# Patient Record
Sex: Male | Born: 1965 | Race: White | Hispanic: No | Smoking: Never smoker
Health system: Southern US, Community
[De-identification: ages and names within clinical notes are randomized; demographics above are authoritative.]

## PROBLEM LIST (undated history)

## (undated) ENCOUNTER — Emergency Department (HOSPITAL_COMMUNITY): Admission: EM | Payer: PRIVATE HEALTH INSURANCE | Source: Home / Self Care

## (undated) DIAGNOSIS — F419 Anxiety disorder, unspecified: Secondary | ICD-10-CM

## (undated) DIAGNOSIS — R740 Nonspecific elevation of levels of transaminase and lactic acid dehydrogenase [LDH]: Secondary | ICD-10-CM

## (undated) DIAGNOSIS — N200 Calculus of kidney: Secondary | ICD-10-CM

## (undated) DIAGNOSIS — M1990 Unspecified osteoarthritis, unspecified site: Secondary | ICD-10-CM

## (undated) DIAGNOSIS — R7401 Elevation of levels of liver transaminase levels: Secondary | ICD-10-CM

## (undated) DIAGNOSIS — N201 Calculus of ureter: Secondary | ICD-10-CM

## (undated) DIAGNOSIS — K259 Gastric ulcer, unspecified as acute or chronic, without hemorrhage or perforation: Secondary | ICD-10-CM

## (undated) DIAGNOSIS — J42 Unspecified chronic bronchitis: Secondary | ICD-10-CM

## (undated) DIAGNOSIS — R0602 Shortness of breath: Secondary | ICD-10-CM

## (undated) HISTORY — PX: TONSILLECTOMY: SUR1361

## (undated) HISTORY — PX: CHOLECYSTECTOMY: SHX55

---

## 2004-02-12 ENCOUNTER — Emergency Department (HOSPITAL_COMMUNITY): Admission: EM | Admit: 2004-02-12 | Discharge: 2004-02-13 | Payer: Self-pay | Admitting: Emergency Medicine

## 2008-12-20 ENCOUNTER — Emergency Department (HOSPITAL_COMMUNITY): Admission: EM | Admit: 2008-12-20 | Discharge: 2008-12-20 | Payer: Self-pay | Admitting: Emergency Medicine

## 2009-05-05 ENCOUNTER — Inpatient Hospital Stay (HOSPITAL_COMMUNITY): Admission: EM | Admit: 2009-05-05 | Discharge: 2009-05-06 | Payer: Self-pay | Admitting: Emergency Medicine

## 2009-05-05 ENCOUNTER — Encounter (INDEPENDENT_AMBULATORY_CARE_PROVIDER_SITE_OTHER): Payer: Self-pay | Admitting: Surgery

## 2010-12-25 LAB — DIFFERENTIAL
Basophils Relative: 0 % (ref 0–1)
Eosinophils Absolute: 0.1 10*3/uL (ref 0.0–0.7)
Lymphs Abs: 1.3 10*3/uL (ref 0.7–4.0)

## 2010-12-25 LAB — LIPASE, BLOOD: Lipase: 23 U/L (ref 11–59)

## 2010-12-25 LAB — POCT CARDIAC MARKERS: Myoglobin, poc: 132 ng/mL (ref 12–200)

## 2010-12-25 LAB — CBC
HCT: 46.4 % (ref 39.0–52.0)
Hemoglobin: 16 g/dL (ref 13.0–17.0)
MCHC: 34.4 g/dL (ref 30.0–36.0)
MCV: 86.5 fL (ref 78.0–100.0)
WBC: 14.9 10*3/uL — ABNORMAL HIGH (ref 4.0–10.5)

## 2010-12-25 LAB — COMPREHENSIVE METABOLIC PANEL
ALT: 68 U/L — ABNORMAL HIGH (ref 0–53)
Albumin: 4.4 g/dL (ref 3.5–5.2)
BUN: 18 mg/dL (ref 6–23)
Calcium: 10.1 mg/dL (ref 8.4–10.5)
Chloride: 101 mEq/L (ref 96–112)
Creatinine, Ser: 1.05 mg/dL (ref 0.4–1.5)
GFR calc Af Amer: >60 mL/min (ref >60)
Glucose, Bld: 162 mg/dL — ABNORMAL HIGH (ref 70–99)

## 2010-12-25 LAB — URINALYSIS, ROUTINE W REFLEX MICROSCOPIC
Bilirubin Urine: NEGATIVE
Specific Gravity, Urine: 1.024 (ref 1.005–1.030)
pH: 8 (ref 5.0–8.0)

## 2010-12-29 LAB — POCT I-STAT, CHEM 8
Calcium, Ion: 1.11 mmol/L — ABNORMAL LOW (ref 1.12–1.32)
Glucose, Bld: 150 mg/dL — ABNORMAL HIGH (ref 70–99)
HCT: 45 % (ref 39.0–52.0)
Sodium: 138 mEq/L (ref 135–145)
TCO2: 25 mmol/L (ref 0–100)

## 2010-12-29 LAB — POCT CARDIAC MARKERS: CKMB, poc: <1 ng/mL — ABNORMAL LOW (ref 1.0–8.0)

## 2010-12-29 LAB — HEPATIC FUNCTION PANEL
ALT: 34 U/L (ref 0–53)
Total Protein: 6.8 g/dL (ref 6.0–8.3)

## 2010-12-29 LAB — LIPASE, BLOOD: Lipase: 24 U/L (ref 11–59)

## 2011-02-01 NOTE — H&P (Signed)
NAMECECILIA, NISHIKAWA NO.:  [id number]   MEDICAL RECORD NO.:  [id number]          PATIENT TYPE:  INP   LOCATION:  5120                         FACILITY:  [facility]   PHYSICIAN:  Currie Paris, M.D.DATE OF BIRTH:  06/16/1966   DATE OF ADMISSION:  05/05/2009  DATE OF DISCHARGE:                              HISTORY & PHYSICAL   CHIEF COMPLAINT:  Abdominal pain.   HISTORY OF PRESENT ILLNESS:  Mr. Landgrebe is a pleasant 45 year old male  who presents with a worsening abdominal pain since last evening.  He and  his family actually state that the pain has been ongoing for several  months on an intermittent basis.  He reports the pain is usually later  in the evening after dinner or sometimes wakes him up from his sleep.  He had previously been told that he may have some gastritis or chronic  gastroesophageal reflux disease and every time, he had an attack he is  sort of shrugged it off as an another bout of indigestion.  However, he  has probably had upwards of 5-6 different episodes of this kind of  abdominal pain over the past several months.  However last evening, he  developed worsening pain about 7:30 in the evening, shortly after  dinner.  However, the pain began to become more constant and severe in  nature to the point where at about 3 in the morning, he could not  tolerate anymore and had to come to the emergency department.  He had  associated little bit of chills and sweats, but no significant fever.  He has been a little bit nauseated, but otherwise, no significant  vomiting.  He denies chest pain, shortness of breath associated with  this.  He denies any hematemesis, hematochezia, or melena.  He denies  changes in his recent bowel functions including diarrhea or  constipation.  The patient essentially does not go to doctors without  any major complaint.  Therefore, past medical history is essentially  negative.  He otherwise presents to the emergency  department with  complaint of significant abdominal pain.   PAST MEDICAL HISTORY:  Negative for any pertinent history other than  possible gastroesophageal reflux disease.   PAST SURGICAL HISTORY:  Negative.   SOCIAL HISTORY:  The patient denies any alcohol or tobacco use or  illicit drug use.  He is married, but currently out of work.   FAMILY HISTORY:  His father was recently diagnosed with coronary artery  disease and had to have coronary artery bypass procedure.  Otherwise,  negative.   MEDICATIONS:  None regular.   ALLERGIES:  The patient reports CODEINE allergy.   REVIEW OF SYSTEMS:  Please see HPI.   PHYSICAL EXAMINATION:  VITAL SIGNS:  Temperature is 98 degrees, pulse  62, respirations 15, and blood pressure 133/79.  GENERAL APPEARANCE:  The patient is somewhat sedated and sleepy  secondary to narcotic pain medicine that was administrated in the  emergency department, but is otherwise in some mild distress.  ENT:  Unremarkable.  NECK:  Supple.  Carotids are 2+ and brisk.  No lymphadenopathy is  appreciated.  CHEST:  Clear to auscultation.  No wheezes, rhonchi, or rales.  HEART:  Regular rate and rhythm.  No murmurs, gallops, or rubs.  2+  peripheral pulses throughout upper and lower extremities.  ABDOMEN:  Soft, nondistended, but tender in the mid epigastric in right  upper quadrant regions.  There is mild guarding, but no rebound or  peritonitis-type signs.  He does have audible bowel sounds and no  surgical scars noted.  GENITOURINARY:  No hernias are appreciated.  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  No genitourinary lesions are noted.  EXTREMITIES:  The patient is full range of motion with regards to active  range of motion and no edema is appreciated.  SKIN:  Warm, dry, good turgor.  No rashes are appreciated.  NEUROLOGIC:  The patient is alert and oriented x3.  Cranial nerves II  through XII grossly intact.   PERTINENT LABORATORY DATA:  White blood cell count  of 14.9, total  bilirubin of 0.9.  CT scan performed shows evidence consistent with  acute cholecystitis including multiple gallstones, gallbladder wall-  thickening, and pericholecystic fluid.  The pancreas and liver show  normal enhancement.   ADMISSION DIAGNOSES:  1. Acute cholecystitis.  2. History of gastroesophageal reflux disease.   ADMISSION PLAN:  Proceed with admission for IV fluid resuscitation and  surgical intervention including laparoscopic cholecystectomy with  cholangiogram.  The procedure of laparoscopic cholecystectomy and  cholangiogram including the risks, complications, benefits, as well as  alternatives to treatment were discussed thoroughly with patient and his  family.  They agreed to proceed with the procedure.  We will start IV  Unasyn 3 g IV, first dose is already administrated here in the emergency  department.  We will admit him to medical surgical floor and again  proceed with laparoscopic cholecystectomy.  This case was discussed at  length with Dr. Cyndia Bent who was in agreement in admission of  the patient.      Brayton El, PA-C      Currie Paris, M.D.  Electronically Signed    KB/MEDQ  D:  05/05/2009  T:  05/05/2009  Job:  [id number]

## 2011-02-01 NOTE — Op Note (Signed)
[name], [name] NO.:  [id number]   MEDICAL RECORD NO.:  [id number]          PATIENT TYPE:  INP   LOCATION:  5120                         FACILITY:  [facility]   PHYSICIAN:  Currie Paris, M.D.DATE OF BIRTH:  April 03, 1966   DATE OF PROCEDURE:  05/05/2009  DATE OF DISCHARGE:                               OPERATIVE REPORT   PREOPERATIVE DIAGNOSIS:  Acute calculous cholecystitis.   POSTOPERATIVE DIAGNOSIS:  Acute calculous cholecystitis.   OPERATION:  Laparoscopic cholecystectomy with cholangiogram.   SURGEON:  Currie Paris, MD   ASSISTANT:  Letha Cape, PA   ANESTHESIA:  General.   CLINICAL HISTORY:  This is a 45 year old gentleman, admitted this  morning via the emergency room with signs and symptoms of acute  cholecystitis, and a CT scan showing appearance consistent with acute  cholecystitis.   DESCRIPTION OF PROCEDURE:  I saw the patient again in the holding area,  and he had no further questions.   He was then taken to the operating room and after satisfactory general  anesthesia had been obtained, the abdomen was clipped, prepped, and  draped.  The time-out was done.   Marcaine 0.25% plain was used for each incision.  The umbilical incision  was made.  The fascia opened and the peritoneal cavity entered under  direct vision.  A purse-string was placed, the Hasson introduced, and  the abdomen insufflated to 15.   The patient was tilted in reverse Trendelenburg and to the left.  Under  direct vision, a 10/11 trocar was placed in the epigastrium and two 5 mm  laterally.   The gallbladder was tense, edematous, and inflamed.  It had not  developed any gangrenous changes.  In order to grasp it, I placed a  suction device through the trocar and aspirated the gallbladder.   Multiple omental adhesions were taken down, and the area of the triangle  of Calot dissected out and identified a long segment of cystic duct.  There was a small  vessel nearby, which I clipped and then the anterior  branch of the artery was also clipped.  I had a nice window and felt  comfortable with the anatomy.   The cystic duct was opened, and a Cook catheter introduced  percutaneously and held with a clip.  Operative cholangiography was done  showing a normal common duct, good filling of the duodenum, no filling  defects, and what appeared to be grossly normal anatomy.   The cystic duct catheter was removed, 3 clips were placed on the cystic  duct, and it was divided.  Two additional clips were placed on the  cystic artery and it was divided.  The gallbladder was removed from  below to above.  A posterior branch of the artery was clipped and an  another higher little branch coming directly out of the gallbladder bed,  and the gallbladder was likewise clipped.   Once the gallbladder was removed, it was placed in a bag.  I irrigated  to make sure everything was dry.  I brought it out the umbilical port.  We then made a final  check for hemostasis and again everything was dry.  The irrigant was suctioned out.  The lateral ports were removed and  there was no bleeding.  The umbilical site was closed with a purse-  string plus a suture of 0 Vicryl.  The abdomen was deflated through the  epigastric port.  Skin was closed with 4-0 Monocryl subcuticular plus  Dermabond.   The patient tolerated the procedure well and there were no  complications.  All counts were correct.      Currie Paris, M.D.  Electronically Signed     CJS/MEDQ  D:  05/05/2009  T:  05/06/2009  Job:  [id number]

## 2011-08-24 ENCOUNTER — Emergency Department (HOSPITAL_COMMUNITY)
Admission: EM | Admit: 2011-08-24 | Discharge: 2011-08-24 | Disposition: A | Discharge status: Home / Self Care | Payer: Self-pay | Attending: Emergency Medicine | Admitting: Emergency Medicine

## 2011-08-24 ENCOUNTER — Emergency Department (HOSPITAL_COMMUNITY): Payer: Self-pay

## 2011-08-24 DIAGNOSIS — K029 Dental caries, unspecified: Secondary | ICD-10-CM | POA: Insufficient documentation

## 2011-08-24 DIAGNOSIS — Z0000 Encounter for general adult medical examination without abnormal findings: Secondary | ICD-10-CM

## 2011-08-24 DIAGNOSIS — R059 Cough, unspecified: Secondary | ICD-10-CM | POA: Insufficient documentation

## 2011-08-24 DIAGNOSIS — R05 Cough: Secondary | ICD-10-CM | POA: Insufficient documentation

## 2011-08-24 HISTORY — DX: Calculus of kidney: N20.0

## 2011-08-24 LAB — CBC
HCT: 45.1 % (ref 39.0–52.0)
Hemoglobin: 15.6 g/dL (ref 13.0–17.0)
MCHC: 34.6 g/dL (ref 30.0–36.0)
WBC: 6.7 10*3/uL (ref 4.0–10.5)

## 2011-08-24 LAB — COMPREHENSIVE METABOLIC PANEL
ALT: 24 U/L (ref 0–53)
AST: 26 U/L (ref 0–37)
Albumin: 3.7 g/dL (ref 3.5–5.2)
Alkaline Phosphatase: 94 U/L (ref 39–117)
BUN: 10 mg/dL (ref 6–23)
CO2: 28 mEq/L (ref 19–32)
Calcium: 9.6 mg/dL (ref 8.4–10.5)
Chloride: 103 mEq/L (ref 96–112)
Creatinine, Ser: 0.89 mg/dL (ref 0.50–1.35)
GFR calc Af Amer: >90 mL/min (ref >90)
GFR calc non Af Amer: >90 mL/min (ref >90)
Glucose, Bld: 96 mg/dL (ref 70–99)
Potassium: 4.2 mEq/L (ref 3.5–5.1)
Sodium: 140 mEq/L (ref 135–145)
Total Bilirubin: 0.5 mg/dL (ref 0.3–1.2)
Total Protein: 7 g/dL (ref 6.0–8.3)

## 2011-08-24 LAB — DIFFERENTIAL
Basophils Relative: 1 % (ref 0–1)
Eosinophils Absolute: 0.2 10*3/uL (ref 0.0–0.7)
Eosinophils Relative: 3 % (ref 0–5)
Monocytes Absolute: 0.3 10*3/uL (ref 0.1–1.0)
Monocytes Relative: 5 % (ref 3–12)
Neutro Abs: 4.4 10*3/uL (ref 1.7–7.7)

## 2011-08-24 LAB — PROTIME-INR
INR: 0.9 (ref 0.00–1.49)
Prothrombin Time: 12.3 seconds (ref 11.6–15.2)

## 2011-08-24 MED ORDER — AMOXICILLIN 500 MG PO CAPS: 500.0000 mg | ORAL_CAPSULE | Freq: Three times a day (TID) | ORAL | Status: AC

## 2011-08-24 NOTE — ED Provider Notes (Signed)
History     CSN: [id number] Arrival date & time: 08/24/2011 11:37 AM   First MD Initiated Contact with Patient 08/24/11 1312      Chief Complaint  Patient presents with  . Coagulation Disorder    (Consider location/radiation/quality/duration/timing/severity/associated sxs/prior treatment) HPI  Patient relates he's worked for jobs for the past 8 years. He also states he is raising 2 teenage children by himself. He relates this year he's had a lot of respiratory problems and up until 3 weeks ago had a very bad cough that his family stated they thought he had walking pneumonia. He's unsure if he had a fever. He also relates about 3 weeks ago had severe pain in his back that lasted about 2 minutes it felt like a kidney stone he's had before. He states he started noticing 6 days ago that when he brushed his teeth he had more blood on his toothbrush. He also has noticed when he clears the throat he has blood-tinged saliva. He denies nosebleed, he states he has been sneezing, denies hematuria blood in the stool, he denies any pain in his teeth. Patient states he's tired. Patient has looking been looking on the Internet and is extremely afraid of what could be wrong with himself. Nothing makes the bleeding worse nothing makes the bleeding better  Family physician none  Past Medical History  Diagnosis Date  . Kidney stones     Past Surgical History  Procedure Date  . Cholecystectomy     No family history on file.  History  Substance Use Topics  . Smoking status: Never Smoker   . Smokeless tobacco: Not on file  . Alcohol Use: No   employed He denies using snuff for other chewable tobacco   Review of Systems  All other systems reviewed and are negative.    Allergies  Codeine  Home Medications  No current outpatient prescriptions on file.  BP 131/83  Pulse 74  Temp(Src) 98.3 F (36.8 C) (Oral)  Resp 12  SpO2 96% Vital signs normal  Physical Exam  Nursing note and  vitals reviewed. Constitutional: He is oriented to person, place, and time. He appears well-developed and well-nourished.  Non-toxic appearance. He does not appear ill. No distress.  HENT:  Head: Normocephalic and atraumatic.  Right Ear: External ear normal.  Left Ear: External ear normal.  Nose: Nose normal. No mucosal edema or rhinorrhea.  Mouth/Throat: Oropharynx is clear and moist and mucous membranes are normal. No dental abscesses or uvula swelling.       Patient has 2 teeth that are rotted to the gum and one on the right 1 on the left but the gums are not inflamed there is no active bleeding seen  Eyes: Conjunctivae and EOM are normal. Pupils are equal, round, and reactive to light.  Neck: Normal range of motion and full passive range of motion without pain. Neck supple.  Cardiovascular: Normal rate, regular rhythm and normal heart sounds.  Exam reveals no gallop and no friction rub.   No murmur heard. Pulmonary/Chest: Effort normal and breath sounds normal. No respiratory distress. He has no wheezes. He has no rhonchi. He has no rales. He exhibits no tenderness and no crepitus.  Abdominal: Soft. Normal appearance and bowel sounds are normal. He exhibits no distension. There is no tenderness. There is no rebound and no guarding.  Musculoskeletal: Normal range of motion. He exhibits no edema and no tenderness.       Moves all extremities well.  Neurological: He is alert and oriented to person, place, and time. He has normal strength. No cranial nerve deficit.  Skin: Skin is warm, dry and intact. No rash noted. No erythema. No pallor.  Psychiatric: His speech is normal and behavior is normal. His mood appears not anxious.       Patient appears very flat and almost becomes tearful when he talks about working.    ED Course  Procedures (including critical care time)   Results for orders placed during the hospital encounter of 08/24/11  CBC      Component Value Range   WBC 6.7  4.0 -  10.5 (K/uL)   RBC 5.42  4.22 - 5.81 (MIL/uL)   Hemoglobin 15.6  13.0 - 17.0 (g/dL)   HCT 19.1  47.8 - 29.5 (%)   MCV 83.2  78.0 - 100.0 (fL)   MCH 28.8  26.0 - 34.0 (pg)   MCHC 34.6  30.0 - 36.0 (g/dL)   RDW 62.1  30.8 - 65.7 (%)   Platelets 185  150 - 400 (K/uL)  DIFFERENTIAL      Component Value Range   Neutrophils Relative 66  43 - 77 (%)   Neutro Abs 4.4  1.7 - 7.7 (K/uL)   Lymphocytes Relative 26  12 - 46 (%)   Lymphs Abs 1.7  0.7 - 4.0 (K/uL)   Monocytes Relative 5  3 - 12 (%)   Monocytes Absolute 0.3  0.1 - 1.0 (K/uL)   Eosinophils Relative 3  0 - 5 (%)   Eosinophils Absolute 0.2  0.0 - 0.7 (K/uL)   Basophils Relative 1  0 - 1 (%)   Basophils Absolute 0.0  0.0 - 0.1 (K/uL)  COMPREHENSIVE METABOLIC PANEL      Component Value Range   Sodium 140  135 - 145 (mEq/L)   Potassium 4.2  3.5 - 5.1 (mEq/L)   Chloride 103  96 - 112 (mEq/L)   CO2 28  19 - 32 (mEq/L)   Glucose, Bld 96  70 - 99 (mg/dL)   BUN 10  6 - 23 (mg/dL)   Creatinine, Ser 8.46  0.50 - 1.35 (mg/dL)   Calcium 9.6  8.4 - 96.2 (mg/dL)   Total Protein 7.0  6.0 - 8.3 (g/dL)   Albumin 3.7  3.5 - 5.2 (g/dL)   AST 26  0 - 37 (U/L)   ALT 24  0 - 53 (U/L)   Alkaline Phosphatase 94  39 - 117 (U/L)   Total Bilirubin 0.5  0.3 - 1.2 (mg/dL)   GFR calc non Af Amer >90  >90 (mL/min)   GFR calc Af Amer >90  >90 (mL/min)  APTT      Component Value Range   aPTT 28  24 - 37 (seconds)  PROTIME-INR      Component Value Range   Prothrombin Time 12.3  11.6 - 15.2 (seconds)   INR 0.90  0.00 - 1.49    . Laboratory interpretation all normal Dg Chest 2 View  08/24/2011  *RADIOLOGY REPORT*  Clinical Data: Hemoptysis.  CHEST - 2 VIEW  Comparison: Chest x-ray 05/05/2009.  Findings: The cardiac silhouette, mediastinal and hilar contours are within normal limits and stable.  The lungs are clear.  No pleural effusion.  The bony thorax is intact.  IMPRESSION: Normal chest x-ray.  Original Report Authenticated By: P. Loralie Champagne, M.D.      Diagnoses that have been ruled out:  Diagnoses that are still under consideration:  Final diagnoses:  Normal physical exam   Plan discharge Devoria Albe, MD, FACEP    MDM          Ward Givens, MD 08/24/11 [phone]

## 2011-08-24 NOTE — ED Notes (Signed)
Pt presents with 1 week h/o bleeding from mouth.  Pt reports he was brushing his teeth with onset of bleeding, but denies any pain in his mouth.  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  Pt reports when he breaths, his chest does not expand like it should.  Pt denies any pain at present.  Pt denies any cough or shortness of breath, denies seeing blood in stool, urine.

## 2013-06-08 ENCOUNTER — Emergency Department (HOSPITAL_COMMUNITY)
Admission: EM | Admit: 2013-06-08 | Discharge: 2013-06-08 | Disposition: A | Discharge status: Home / Self Care | Payer: PRIVATE HEALTH INSURANCE | Attending: Emergency Medicine | Admitting: Emergency Medicine

## 2013-06-08 ENCOUNTER — Emergency Department (HOSPITAL_COMMUNITY): Payer: PRIVATE HEALTH INSURANCE

## 2013-06-08 ENCOUNTER — Encounter (HOSPITAL_COMMUNITY): Payer: Self-pay | Admitting: Emergency Medicine

## 2013-06-08 DIAGNOSIS — E278 Other specified disorders of adrenal gland: Secondary | ICD-10-CM

## 2013-06-08 DIAGNOSIS — R0602 Shortness of breath: Secondary | ICD-10-CM | POA: Insufficient documentation

## 2013-06-08 DIAGNOSIS — R61 Generalized hyperhidrosis: Secondary | ICD-10-CM | POA: Insufficient documentation

## 2013-06-08 DIAGNOSIS — N200 Calculus of kidney: Secondary | ICD-10-CM

## 2013-06-08 DIAGNOSIS — N201 Calculus of ureter: Secondary | ICD-10-CM

## 2013-06-08 DIAGNOSIS — R110 Nausea: Secondary | ICD-10-CM | POA: Insufficient documentation

## 2013-06-08 DIAGNOSIS — N289 Disorder of kidney and ureter, unspecified: Secondary | ICD-10-CM | POA: Insufficient documentation

## 2013-06-08 DIAGNOSIS — R079 Chest pain, unspecified: Secondary | ICD-10-CM | POA: Insufficient documentation

## 2013-06-08 DIAGNOSIS — Z79899 Other long term (current) drug therapy: Secondary | ICD-10-CM | POA: Insufficient documentation

## 2013-06-08 HISTORY — DX: Calculus of ureter: N20.1

## 2013-06-08 LAB — CBC WITH DIFFERENTIAL/PLATELET
Basophils Absolute: 0 10*3/uL (ref 0.0–0.1)
Eosinophils Absolute: 0.3 10*3/uL (ref 0.0–0.7)
Eosinophils Relative: 4 % (ref 0–5)
HCT: 42.1 % (ref 39.0–52.0)
Hemoglobin: 14.9 g/dL (ref 13.0–17.0)
Lymphocytes Relative: 16 % (ref 12–46)
MCH: 29.6 pg (ref 26.0–34.0)
MCV: 83.5 fL (ref 78.0–100.0)
Monocytes Absolute: 0.3 10*3/uL (ref 0.1–1.0)
Monocytes Relative: 4 % (ref 3–12)
Neutro Abs: 5.2 10*3/uL (ref 1.7–7.7)
RDW: 12.2 % (ref 11.5–15.5)
WBC: 6.9 10*3/uL (ref 4.0–10.5)

## 2013-06-08 LAB — COMPREHENSIVE METABOLIC PANEL
ALT: 37 U/L (ref 0–53)
BUN: 16 mg/dL (ref 6–23)
CO2: 25 mEq/L (ref 19–32)
Calcium: 9.3 mg/dL (ref 8.4–10.5)
Chloride: 100 mEq/L (ref 96–112)
Creatinine, Ser: 0.94 mg/dL (ref 0.50–1.35)
GFR calc Af Amer: >90 mL/min (ref >90)
GFR calc non Af Amer: >90 mL/min (ref >90)
Glucose, Bld: 118 mg/dL — ABNORMAL HIGH (ref 70–99)
Total Bilirubin: 0.5 mg/dL (ref 0.3–1.2)
Total Protein: 6.8 g/dL (ref 6.0–8.3)

## 2013-06-08 LAB — URINALYSIS, ROUTINE W REFLEX MICROSCOPIC
Bilirubin Urine: NEGATIVE
Glucose, UA: NEGATIVE mg/dL
Hgb urine dipstick: NEGATIVE
Ketones, ur: 15 mg/dL — AB
Leukocytes, UA: NEGATIVE
Nitrite: NEGATIVE
Protein, ur: NEGATIVE mg/dL
Specific Gravity, Urine: >1.046 — ABNORMAL HIGH (ref 1.005–1.030)
Urobilinogen, UA: 0.2 mg/dL (ref 0.0–1.0)
pH: 7 (ref 5.0–8.0)

## 2013-06-08 LAB — TROPONIN I
Troponin I: <0.3 ng/mL
Troponin I: <0.3 ng/mL

## 2013-06-08 LAB — LIPASE, BLOOD: Lipase: 23 U/L (ref 11–59)

## 2013-06-08 LAB — LACTIC ACID, PLASMA
Lactic Acid, Venous: 2.6 mmol/L — ABNORMAL HIGH (ref 0.5–2.2)
Lactic Acid, Venous: 1.8 mmol/L (ref 0.5–2.2)

## 2013-06-08 MED ORDER — HYDROMORPHONE HCL PF 1 MG/ML IJ SOLN
1.0000 mg | Freq: Once | INTRAMUSCULAR | Status: AC
  Administered 2013-06-08: 1 mg via INTRAVENOUS
  Filled 2013-06-08: qty 1

## 2013-06-08 MED ORDER — ONDANSETRON HCL 4 MG/2ML IJ SOLN
4.0000 mg | Freq: Once | INTRAMUSCULAR | Status: AC
  Administered 2013-06-08: 4 mg via INTRAVENOUS
  Filled 2013-06-08: qty 2

## 2013-06-08 MED ORDER — ONDANSETRON HCL 4 MG PO TABS: 4.0000 mg | ORAL_TABLET | Freq: Four times a day (QID) | ORAL | Status: DC

## 2013-06-08 MED ORDER — OXYCODONE-ACETAMINOPHEN 5-325 MG PO TABS: 1.0000 | ORAL_TABLET | ORAL | Status: DC | PRN

## 2013-06-08 MED ORDER — IOHEXOL 300 MG/ML  SOLN
100.0000 mL | Freq: Once | INTRAMUSCULAR | Status: AC | PRN
  Administered 2013-06-08: 100 mL via INTRAVENOUS

## 2013-06-08 MED ORDER — IOHEXOL 300 MG/ML  SOLN
25.0000 mL | INTRAMUSCULAR | Status: DC | PRN
  Administered 2013-06-08: 25 mL via ORAL

## 2013-06-08 MED ORDER — SODIUM CHLORIDE 0.9 % IV BOLUS (SEPSIS)
1000.0000 mL | Freq: Once | INTRAVENOUS | Status: AC
  Administered 2013-06-08: 1000 mL via INTRAVENOUS

## 2013-06-08 MED ORDER — TAMSULOSIN HCL 0.4 MG PO CAPS: 0.4000 mg | ORAL_CAPSULE | Freq: Every day | ORAL | Status: DC

## 2013-06-08 MED ORDER — HYDROMORPHONE HCL PF 1 MG/ML IJ SOLN: 1.0000 mg | Freq: Once | INTRAMUSCULAR | Status: DC

## 2013-06-08 MED ORDER — ONDANSETRON HCL 4 MG/2ML IJ SOLN
4.0000 mg | Freq: Once | INTRAMUSCULAR | Status: AC
Start: 2013-06-08 — End: 2013-06-08
  Administered 2013-06-08: 4 mg via INTRAVENOUS
  Filled 2013-06-08: qty 2

## 2013-06-08 NOTE — ED Notes (Signed)
Patient returned from CT

## 2013-06-08 NOTE — ED Provider Notes (Signed)
CSN: [id number]     Arrival date & time 06/08/13  0900 History   First MD Initiated Contact with Patient 06/08/13 0900     Chief Complaint  Patient presents with  . Abdominal Pain   (Consider location/radiation/quality/duration/timing/severity/associated sxs/prior Treatment) The history is provided by the patient. No language interpreter was used.  [name] is a 47 y/o M with PMHx of kidney stones approximately 5 years ago presenting to the ED with sudden onset of abdominal pain that started at approximately 7:15-8:15 AM this morning. Patient reported that he was laying in bed trying to go back to sleep when he had sudden onset of pain. Patient reported that the abdominal pain is localized to the left side of the abdomen, patient unable to described the pain - reported that it feels like it did when he got his gallbladder removed. Reported that the pain hurt so bad that he was unable to get out of bed to go to the bathroom. Reported that when he had onset of pain he experienced diaphoresis. Patient reported that the pain is episodic and when the pain comes it comes on strong. Patient reported that the pain is localized to the left side without radiation. Patient stated that he has been feeling nauseous ever since the pain started, but denied emesis. When asked about chest pain, shortness of breathe and difficulty breathing patient reported that he always has this. Denied vomiting, diarrhea, melena, hematochezia, neck pain, neck stiffness, back pain, fever, chills.  PCP none  Past Medical History  Diagnosis Date  . Kidney stones    Past Surgical History  Procedure Laterality Date  . Cholecystectomy     No family history on file. History  Substance Use Topics  . Smoking status: Never Smoker   . Smokeless tobacco: Not on file  . Alcohol Use: No    Review of Systems  Constitutional: Positive for diaphoresis. Negative for fever and chills.  HENT: Negative for trouble swallowing and  neck pain.   Eyes: Negative for visual disturbance.  Respiratory: Positive for shortness of breath (reported that this is baseline for him). Negative for chest tightness.   Cardiovascular: Positive for chest pain (reported that this is baseline for him ).  Gastrointestinal: Positive for nausea and abdominal pain. Negative for vomiting, diarrhea, constipation and blood in stool.  Genitourinary: Negative for dysuria, hematuria, flank pain and decreased urine volume.  Musculoskeletal: Negative for back pain.  Neurological: Negative for dizziness, weakness, numbness and headaches.  All other systems reviewed and are negative.    Allergies  Codeine  Home Medications   Current Outpatient Rx  Name  Route  Sig  Dispense  Refill  . naproxen sodium (ANAPROX) 220 MG tablet   Oral   Take 220 mg by mouth every 12 (twelve) hours as needed (for pain).         . ondansetron (ZOFRAN) 4 MG tablet   Oral   Take 1 tablet (4 mg total) by mouth every 6 (six) hours.   12 tablet   0   . oxyCODONE-acetaminophen (PERCOCET/ROXICET) 5-325 MG per tablet   Oral   Take 1 tablet by mouth every 4 (four) hours as needed for pain.   6 tablet   0   . tamsulosin (FLOMAX) 0.4 MG CAPS capsule   Oral   Take 1 capsule (0.4 mg total) by mouth daily.   15 capsule   0    BP 126/87  Pulse 81  Temp(Src) 97.9 F (36.6 C) (  Oral)  Resp 9  SpO2 87% Physical Exam  Nursing note and vitals reviewed. Constitutional: He is oriented to person, place, and time. He appears well-developed and well-nourished. No distress.  HENT:  Head: Normocephalic.  Mouth/Throat: Oropharynx is clear and moist. No oropharyngeal exudate.  Eyes: Conjunctivae and EOM are normal. Pupils are equal, round, and reactive to light. Right eye exhibits no discharge. Left eye exhibits no discharge.  Neck: Normal range of motion. Neck supple.  Negative neck stiffness Negative nuchal rigidity Negative cervical lypmhadenopathy  Cardiovascular:  Normal rate, regular rhythm and normal heart sounds.  Exam reveals no friction rub.   No murmur heard. Pulses:      Radial pulses are 2+ on the right side, and 2+ on the left side.       Dorsalis pedis pulses are 2+ on the right side, and 2+ on the left side.  Pulmonary/Chest: Effort normal and breath sounds normal. No respiratory distress. He has no wheezes. He has no rales. He exhibits no tenderness.  Abdominal: Soft. Bowel sounds are normal. He exhibits no distension. There is no tenderness. There is no rebound and no guarding.  Negative pain upon palpation to the abdomen - patient reported that the pain is "deep."   Musculoskeletal: Normal range of motion.  Lymphadenopathy:    He has no cervical adenopathy.  Neurological: He is alert and oriented to person, place, and time. He exhibits normal muscle tone. Coordination normal.  Skin: Skin is warm and dry. No rash noted. He is not diaphoretic. No erythema.  Psychiatric: He has a normal mood and affect. His behavior is normal. Thought content normal.    ED Course  Procedures (including critical care time)  3:10 PM Discussed labs and imaging results with Dr. Genene Churn - recommended lactic acid to be repeated. Discussed patient's pain to be get under control and for patient to be discharged if lactic acid is decreased. If lactic acid has decreased patient to be discharged with Flomax, pain medications, and anti-emetics, along with strainer. Urology follow-up as outpatient needed.   3:20 PM Discussed labs and imaging with patient in great detail. Patient reported that his pain is better and rated the discomfort as a 3/10. Discussed with patient regarding the mass localized to the left adrenal gland and that he needs to follow-up with Endocrinology regarding this issue for monitoring - patient understood. Discussed with patient plan regarding re-checking the lactic acid.  4:49 PM Second lactic acid decreased from 2.6 to 1.8 - patient cleared for  discharged. Discharged patient. Reported that pain was controlled. Discussed with patient plan for discharge and that he is to follow-up as outpatient. Patient's significant other at bedside.    Date: 06/08/2013  Rate: 68  Rhythm: normal sinus rhythm  QRS Axis: normal  Intervals: normal  ST/T Wave abnormalities: normal  Conduction Disutrbances:none  Narrative Interpretation:   Old EKG Reviewed: none available EKG analyzed reviewed by this provider and attending physician.  Labs Review Labs Reviewed  COMPREHENSIVE METABOLIC PANEL - Abnormal; Notable for the following:    Glucose, Bld 118 (*)    All other components within normal limits  LACTIC ACID, PLASMA - Abnormal; Notable for the following:    Lactic Acid, Venous 2.6 (*)    All other components within normal limits  URINALYSIS, ROUTINE W REFLEX MICROSCOPIC - Abnormal; Notable for the following:    Specific Gravity, Urine >1.046 (*)    Ketones, ur 15 (*)    All other components within normal  limits  CBC WITH DIFFERENTIAL  LIPASE, BLOOD  TROPONIN I  TROPONIN I  LACTIC ACID, PLASMA   Imaging Review Dg Chest 2 View  06/08/2013   *RADIOLOGY REPORT*  Clinical Data: Abdomen pain, nausea, vomiting  CHEST - 2 VIEW  Comparison: August 24, 2011  Findings: There is no focal infiltrate, pulmonary edema, or pleural effusion.  The mediastinal contour and cardiac silhouette are stable. The soft tissues and osseous structures are stable. Surgical clips are noted in the upper abdomen.  IMPRESSION: No acute cardiopulmonary disease identified.   Original Report Authenticated By: Sherian Rein, M.D.   Ct Abdomen Pelvis W Contrast  06/08/2013   CLINICAL DATA:  Abdominal pain  EXAM: CT ABDOMEN AND PELVIS WITH CONTRAST  TECHNIQUE: Multidetector CT imaging of the abdomen and pelvis was performed using the standard protocol following bolus administration of intravenous contrast. Oral contrast was also administered.  CONTRAST:  OMNIPAQUE IOHEXOL  300 MG/ML  SOLN  COMPARISON:  May 05, 2009  FINDINGS: There is mild bibasilar lung atelectasis. Lung bases are otherwise clear. There is a small hiatal hernia.  The liver is enlarged, measuring 20.3 cm in length. There is fatty infiltration in the liver. No focal liver lesions are identified. There is no biliary duct dilatation. Gallbladder is absent.  Spleen, pancreas and right adrenal appear normal. There is a 7 x 7 mm mass in the left adrenal gland.  The right kidney contains a 2 mm calculus in the upper to midpole region, nonobstructing. There is no mass or hydronephrosis on the right. There is perinephric fluid on the left with moderate hydronephrosis of the left kidney. There is no intrarenal mass or calculus. There is a 3 mm calculus at the left ureterovesical junction causing hydronephrosis on the left. Note that there is some fluid tracking along the proximal left ureter, probably due to urine extravasation proximally.  In the pelvis, there is no mass or fluid collection. Appendix appears normal.  There is no bowel obstruction. No free air or portal venous air.  There is no ascites, adenopathy, or abscess in the abdomen or pelvis. Aorta is non aneurysmal. There are no blastic or lytic bone lesions. There is degenerative change at L5-S1.  IMPRESSION: 3 mm calculus at the left ureterovesical junction causing moderate hydronephrosis on the left. There is left-sided perinephric fluid as well as urine tracking along the proximal left ureter.  Small nonobstructing calculus in right kidney.  The gallbladder is absent.  The liver is enlarged with fatty infiltration.  Small hiatal hernia.  No bowel obstruction. No abscess.  Subcentimeter left adrenal mass. Particular attention to this area on subsequent evaluations is advised.   Electronically Signed   By: Bretta Bang   On: 06/08/2013 14:29    MDM   1. Nephrolithiasis   2. Adrenal mass, left     Patient presenting to emergency department with  abdominal pain that started at approximately 7:15 2 8:15 AM this morning while patient is lying in bed trying to go back to sleep. Patient reported that the abdominal pain is localized to the left side, described as an episodic pain without radiation-reported that the pain feels as if he got his gallbladder removed. Reported that he's been feeling nauseous, negative emesis noted. Denied fever, chills. Alert and oriented. Lungs clear to auscultation bilaterally. Heart rate and rhythm normal. Negative distention to the abdomen, and normal appearance. Bowel sounds normoactive in all 4 quadrants. Negative pain upon palpation to all 4 quadrants. When stomach  palpated patient reported that the pain with deep. CBC negative elevation in white blood cell count, negative findings-negative leukocytosis. CMP negative findings. Lipase negative elevation. Lactic acid mildly elevated at 2.6. Chest x-ray no acute cardiopulmonary disease identified. EKG negative ischemic changes identified. Two sets of troponins with negative elevation. CT abdomen and pelvis with contrast identified a 3 mm calculus at the left uterovesical junction causing moderate hydronephrosis on the left side, there is left-sided perinephric fluid as well as urine tracking along the proximal left ureter. The liver is enlarged with fatty infiltration, small hiatal hernia is identified. Subcentimeter left adrenal mass, particularly attention to this area on subsequent evaluations is advised. Urine negative for hemoglobin or infection - negative signs of pyelonephritis. Second lactic acid decreased 1.8.  Had a long discussion with patient regarding imaging and lab results. Doubt ACS - patient denied chest pain while staying in the ED, denied shortness of breath while staying in the ED - EKG negative ischemic changes noted with negative elevation in troponins x 2. Patient hemodynamically stable. Patient presenting with nephrolithiasis in the ureterovesical  junction. Patient hemodynamically stable - afebrile. Pain controlled in ED setting. Negative pyelonephritis. Negative acute appendicitis. Negative acute abdominal idenitifications. Pain controlled in ED setting. Discharged patient with pain medications - discussed course, precautions, disposal technique - antiemetics, and flomax. Referred patient to urology, endocrinology, and health and wellness center. Discussed with patient the findings regarding the mass noted to the left adrenal gland, discussed with patient the importance of following up with this. Discussed with patient to rest and stay hydrated. Discussed with patient to continue to monitor symptoms and if symptoms are to worsen or change to report back to the ED - strict return instructions given.  Patient agreed to plan of care, understood, all questions answered.     Raymon Mutton, PA-C 06/08/13 1904

## 2013-06-08 NOTE — ED Notes (Signed)
Pt father's number. Al [phone]

## 2013-06-08 NOTE — ED Notes (Signed)
Patient transported to CT 

## 2013-06-08 NOTE — ED Notes (Signed)
Discussed need for urine analysis with patient; still refuses I&O cath. He is attempting to urinate again at this time.

## 2013-06-08 NOTE — ED Notes (Signed)
Refuses I&O cath. States will urinate as soon as he can. Drinking contrast for CT at this time.

## 2013-06-08 NOTE — ED Notes (Signed)
Patient finished oral contrast. CT notified.

## 2013-06-08 NOTE — ED Notes (Signed)
Pt c/o left lower abdominal quadrant pain around 0615 this morning that caused him to become diaphoretic. Pt c/o 10/10 initially but at this time is 6/10. Denies any trouble urinating.

## 2013-06-10 ENCOUNTER — Observation Stay (HOSPITAL_COMMUNITY)
Admission: EM | Admit: 2013-06-10 | Discharge: 2013-06-11 | Disposition: A | Discharge status: Home / Self Care | Payer: PRIVATE HEALTH INSURANCE | Attending: Internal Medicine | Admitting: Internal Medicine

## 2013-06-10 ENCOUNTER — Observation Stay (HOSPITAL_COMMUNITY): Payer: PRIVATE HEALTH INSURANCE

## 2013-06-10 ENCOUNTER — Encounter (HOSPITAL_COMMUNITY): Payer: Self-pay

## 2013-06-10 DIAGNOSIS — N179 Acute kidney failure, unspecified: Principal | ICD-10-CM

## 2013-06-10 DIAGNOSIS — N200 Calculus of kidney: Secondary | ICD-10-CM

## 2013-06-10 DIAGNOSIS — N201 Calculus of ureter: Secondary | ICD-10-CM | POA: Insufficient documentation

## 2013-06-10 DIAGNOSIS — R7989 Other specified abnormal findings of blood chemistry: Secondary | ICD-10-CM

## 2013-06-10 DIAGNOSIS — R7402 Elevation of levels of lactic acid dehydrogenase (LDH): Secondary | ICD-10-CM | POA: Insufficient documentation

## 2013-06-10 DIAGNOSIS — R7401 Elevation of levels of liver transaminase levels: Secondary | ICD-10-CM | POA: Insufficient documentation

## 2013-06-10 DIAGNOSIS — R1032 Left lower quadrant pain: Secondary | ICD-10-CM | POA: Insufficient documentation

## 2013-06-10 HISTORY — DX: Shortness of breath: R06.02

## 2013-06-10 HISTORY — DX: Nonspecific elevation of levels of transaminase and lactic acid dehydrogenase (ldh): R74.0

## 2013-06-10 HISTORY — DX: Anxiety disorder, unspecified: F41.9

## 2013-06-10 HISTORY — DX: Elevation of levels of liver transaminase levels: R74.01

## 2013-06-10 HISTORY — DX: Unspecified osteoarthritis, unspecified site: M19.90

## 2013-06-10 HISTORY — DX: Calculus of ureter: N20.1

## 2013-06-10 HISTORY — DX: Gastric ulcer, unspecified as acute or chronic, without hemorrhage or perforation: K25.9

## 2013-06-10 HISTORY — DX: Unspecified chronic bronchitis: J42

## 2013-06-10 LAB — CBC WITH DIFFERENTIAL/PLATELET
Basophils Absolute: 0 10*3/uL (ref 0.0–0.1)
Basophils Relative: 0 % (ref 0–1)
Eosinophils Absolute: 0.1 10*3/uL (ref 0.0–0.7)
Eosinophils Relative: 1 % (ref 0–5)
Lymphs Abs: 0.8 10*3/uL (ref 0.7–4.0)
MCH: 30.3 pg (ref 26.0–34.0)
MCV: 83.3 fL (ref 78.0–100.0)
Neutrophils Relative %: 82 % — ABNORMAL HIGH (ref 43–77)
Platelets: 140 10*3/uL — ABNORMAL LOW (ref 150–400)
RBC: 4.62 MIL/uL (ref 4.22–5.81)
RDW: 12.1 % (ref 11.5–15.5)

## 2013-06-10 LAB — COMPREHENSIVE METABOLIC PANEL
ALT: 202 U/L — ABNORMAL HIGH (ref 0–53)
AST: 121 U/L — ABNORMAL HIGH (ref 0–37)
Albumin: 3.6 g/dL (ref 3.5–5.2)
Alkaline Phosphatase: 129 U/L — ABNORMAL HIGH (ref 39–117)
CO2: 27 mEq/L (ref 19–32)
Calcium: 8.9 mg/dL (ref 8.4–10.5)
Creatinine, Ser: 1.6 mg/dL — ABNORMAL HIGH (ref 0.50–1.35)
GFR calc Af Amer: 59 mL/min — ABNORMAL LOW (ref >90)
Glucose, Bld: 119 mg/dL — ABNORMAL HIGH (ref 70–99)
Potassium: 3.7 mEq/L (ref 3.5–5.1)
Sodium: 136 mEq/L (ref 135–145)
Total Protein: 6.6 g/dL (ref 6.0–8.3)

## 2013-06-10 LAB — URINALYSIS, ROUTINE W REFLEX MICROSCOPIC
Glucose, UA: NEGATIVE mg/dL
Ketones, ur: 15 mg/dL — AB
Nitrite: NEGATIVE
Specific Gravity, Urine: 1.026 (ref 1.005–1.030)
pH: 5.5 (ref 5.0–8.0)

## 2013-06-10 LAB — ACETAMINOPHEN LEVEL: Acetaminophen (Tylenol), Serum: <15 ug/mL (ref 10–30)

## 2013-06-10 MED ORDER — ONDANSETRON HCL 4 MG/2ML IJ SOLN: 4.0000 mg | Freq: Four times a day (QID) | INTRAMUSCULAR | Status: DC | PRN

## 2013-06-10 MED ORDER — ONDANSETRON HCL 4 MG/2ML IJ SOLN
4.0000 mg | Freq: Once | INTRAMUSCULAR | Status: AC
  Administered 2013-06-10: 4 mg via INTRAVENOUS
  Filled 2013-06-10: qty 2

## 2013-06-10 MED ORDER — ONDANSETRON HCL 4 MG PO TABS: 4.0000 mg | ORAL_TABLET | Freq: Four times a day (QID) | ORAL | Status: DC | PRN

## 2013-06-10 MED ORDER — SODIUM CHLORIDE 0.9 % IV SOLN
INTRAVENOUS | Status: DC
  Administered 2013-06-10 – 2013-06-11 (×4): via INTRAVENOUS

## 2013-06-10 MED ORDER — KETOROLAC TROMETHAMINE 30 MG/ML IJ SOLN
30.0000 mg | Freq: Once | INTRAMUSCULAR | Status: AC
  Administered 2013-06-10: 30 mg via INTRAVENOUS
  Filled 2013-06-10: qty 1

## 2013-06-10 MED ORDER — HYDROMORPHONE HCL PF 1 MG/ML IJ SOLN
1.0000 mg | Freq: Once | INTRAMUSCULAR | Status: AC
  Administered 2013-06-10: 1 mg via INTRAVENOUS
  Filled 2013-06-10: qty 1

## 2013-06-10 MED ORDER — TAMSULOSIN HCL 0.4 MG PO CAPS
0.4000 mg | ORAL_CAPSULE | Freq: Two times a day (BID) | ORAL | Status: DC
  Filled 2013-06-10 (×4): qty 1

## 2013-06-10 MED ORDER — SENNA 8.6 MG PO TABS
1.0000 | ORAL_TABLET | Freq: Two times a day (BID) | ORAL | Status: DC
  Filled 2013-06-10 (×4): qty 1

## 2013-06-10 MED ORDER — HEPARIN SODIUM (PORCINE) 5000 UNIT/ML IJ SOLN
5000.0000 [IU] | Freq: Three times a day (TID) | INTRAMUSCULAR | Status: DC
  Filled 2013-06-10 (×5): qty 1

## 2013-06-10 MED ORDER — MORPHINE SULFATE 2 MG/ML IJ SOLN: 1.0000 mg | INTRAMUSCULAR | Status: DC | PRN

## 2013-06-10 MED ORDER — SODIUM CHLORIDE 0.9 % IV BOLUS (SEPSIS)
500.0000 mL | Freq: Once | INTRAVENOUS | Status: AC
  Administered 2013-06-10: 500 mL via INTRAVENOUS

## 2013-06-10 MED ORDER — TAMSULOSIN HCL 0.4 MG PO CAPS
0.4000 mg | ORAL_CAPSULE | Freq: Every day | ORAL | Status: DC
  Filled 2013-06-10: qty 1

## 2013-06-10 MED ORDER — SODIUM CHLORIDE 0.9 % IV BOLUS (SEPSIS)
1000.0000 mL | Freq: Once | INTRAVENOUS | Status: AC
  Administered 2013-06-10: 1000 mL via INTRAVENOUS

## 2013-06-10 NOTE — H&P (Signed)
Date: 06/10/2013               Patient Name:  [name] MRN: [id number]  DOB: 23-Mar-1966 Age / Sex: 47 y.o., male   PCP: No Pcp Per Patient         Medical Service: Internal Medicine Teaching Service         Attending Physician: Dr. Inez Catalina, MD    First Contact: Dr. Delane Ginger Pager: [phone]  Second Contact: Dr. Dorise Hiss Pager: [phone]       After Hours (After 5p/  First Contact Pager: [phone]  weekends / holidays): Second Contact Pager: [phone]   Chief Complaint: Abdominal Pain  History of Present Illness: Pt is a 47 yo CM with a PMH of nephrolithiasis who presents to the ED with LLQ abdominal pain since Saturday. He came to the ED on Saturday with similar pain and was diagnosed with nephrolithiasis via CT of Abd/Pelvis w/contrast. He had a right kidney calculus of 2mm in the upper/midpole that was non-obstructing with no associated hydronephrosis. There was also a 3mm calculus at the left ureterovesical junction with associated hydronephrosis. He was told to drink plenty of fluids and given zofran, percocet, and flomax in the ED on Saturday and was given a referral to a urologist. Unfortunately, he continued to have pain despite taking pain medicine and came back to the ED today. He reports pain localized to the LLQ and non-radiating. He denies any fevers but does report chills. He reports some associated nausea but denies emesis. He denies any hematuria or dysuria. He reports the pain feels similar to the pain when he had his gallbladder removed.    Meds: Current Facility-Administered Medications  Medication Dose Route Frequency Provider Last Rate Last Dose  . 0.9 %  sodium chloride infusion   Intravenous Continuous Judie Bonus, MD      . heparin injection 5,000 Units  5,000 Units Subcutaneous Q8H Judie Bonus, MD      . morphine 2 MG/ML injection 1-2 mg  1-2 mg Intravenous Q3H PRN Judie Bonus, MD      . ondansetron [facility]) tablet 4 mg  4 mg Oral Q6H PRN  Judie Bonus, MD       Or  . ondansetron [facility]) injection 4 mg  4 mg Intravenous Q6H PRN Judie Bonus, MD      . Gwyndolyn [facility]) tablet 8.6 mg  1 tablet Oral BID Judie Bonus, MD      . tamsulosin (FLOMAX) capsule 0.4 mg  0.4 mg Oral Daily Judie Bonus, MD        Allergies: Allergies as of 06/10/2013 - Review Complete 06/10/2013  Allergen Reaction Noted  . Codeine Nausea Only 08/24/2011   Past Medical History  Diagnosis Date  . Kidney stones   . Ureteral stone 06/08/2013    left distal/notes 06/10/2013   Past Surgical History  Procedure Laterality Date  . Cholecystectomy     History reviewed. No pertinent family history. History   Social History  . Marital Status: Divorced    Spouse Name: N/A    Number of Children: N/A  . Years of Education: N/A   Occupational History  . Not on file.   Social History Main Topics  . Smoking status: Never Smoker   . Smokeless tobacco: Not on file  . Alcohol Use: No  . Drug Use: No  . Sexual Activity: Not on file   Other Topics Concern  . Not on  file   Social History Narrative  . No narrative on file    Review of Systems: Pertinent items are noted in HPI.  Physical Exam: Blood pressure 125/80, pulse 68, temperature 97.8 F (36.6 C), temperature source Oral, resp. rate 20, SpO2 100.00%. Constitutional: Vital signs reviewed.  Patient is a well-developed and well-nourished male in no acute distress and cooperative with exam. Head: Normocephalic and atraumatic Eyes: PERRL, EOMI, conjunctivae normal, No scleral icterus.  Neck: Supple, Trachea midline normal ROM, No JVD, or carotid bruit present.  Cardiovascular: RRR, S1 normal, S2 normal, no MRG, pulses symmetric and intact bilaterally Pulmonary/Chest: normal respiratory effort, CTAB, no wheezes, rales, or rhonchi Abdominal: Soft. LLQ tenderness to palpation, no guarding or rebound; bowel sounds are normal; no CVA tenderness Neurological: A&O x3, cranial  nerve II-XII are grossly intact, no focal motor deficit  Skin: Warm, dry and intact. No rash, cyanosis, or clubbing.       Lab results: Basic Metabolic Panel:  Recent Labs  16/10/96 1012 06/10/13 1105  NA 136 136  K 3.7 3.7  CL 100 101  CO2 25 27  GLUCOSE 118* 119*  BUN 16 17  CREATININE 0.94 1.60*  CALCIUM 9.3 8.9   Liver Function Tests:  Recent Labs  06/08/13 1012 06/10/13 1105  AST 22 121*  ALT 37 202*  ALKPHOS 83 129*  BILITOT 0.5 0.8  PROT 6.8 6.6  ALBUMIN 4.1 3.6    Recent Labs  06/08/13 1012  LIPASE 23   CBC:  Recent Labs  06/08/13 1012 06/10/13 1105  WBC 6.9 7.8  NEUTROABS 5.2 6.4  HGB 14.9 14.0  HCT 42.1 38.5*  MCV 83.5 83.3  PLT 172 140*   Cardiac Enzymes:  Recent Labs  06/08/13 1023 06/08/13 1330  TROPONINI <0.30 <0.30    Urinalysis:  Recent Labs  06/08/13 1446 06/10/13 1128  COLORURINE YELLOW AMBER*  LABSPEC >1.046* 1.026  PHURINE 7.0 5.5  GLUCOSEU NEGATIVE NEGATIVE  HGBUR NEGATIVE NEGATIVE  BILIRUBINUR NEGATIVE SMALL*  KETONESUR 15* 15*  PROTEINUR NEGATIVE NEGATIVE  UROBILINOGEN 0.2 1.0  NITRITE NEGATIVE NEGATIVE  LEUKOCYTESUR NEGATIVE NEGATIVE    Imaging results:  Dg Abd 1 View  06/10/2013   CLINICAL DATA:  47 year old male with abdominal pain. History of obstructing left ureteral calculus.  EXAM: ABDOMEN - 1 VIEW  COMPARISON:  [facility]body T9 2014.  FINDINGS: Non obstructed bowel gas pattern. Oral contrast now on the right and proximal transverse colon. Punctate right renal calculus not identified.  Small distal obstructing left ureteral calculus which was at the left ureterovesical junction at the time of the comparison may be identified over the bladder (arrow). The bladder is mildly more distended. No other urologic calculus identified. No acute osseous abnormality identified.  IMPRESSION: Possible visualization of the distal obstructing left UVJ calculus as seen on 05/2013, currently projecting over the bladder.    Electronically Signed   By: Augusto Gamble M.D.   On: 06/10/2013 15:08    Other results: ED ECG REPORT   Date: 06/10/2013  EKG Time: 10:47AM  Rate: 68  Rhythm: normal sinus rhythm  Axis: Normal  Intervals:none  ST&T Change: None       Assessment & Plan by Problem:  Pt is a 47 yo CM with a PMH of nephrolithiasis who presents to the ED with LLQ abdominal pain.  1. Abdominal Pain-Pt presents with LLQ abdominal pain since Saturday when he was diagnosed with nephrolithiasis and sent home with pain meds, flomax, and to increase his fluid intake.  He has experienced nephrolithiasis in the past. He denies any hematuria or dysuria but does report some nausea but no emesis. CT scan on 9/20 shows an enlarged liver measuring 20.3cm in length with a 7x36mm left adrenal gland mass. The right kidney contains a 2 mm calculus in the upper pole which is nonobstructing. There is a 3mm calculus at the left ureterovesical junction causing hydronephrosis on the left with some fluid tracking along the proximal left ureter. Most likely etiology is nephrolithiasis. Pt creatinine is elevated to 1.60 from 0.94 in two days. UA shows increased sp and ketones but no nitrites or leukocytes.  Additionally, his transaminases are elevated from two days ago for unknown reasons.   -urology following -renal US to r/o obstruction -CMP, CBC in am -PT/INR -spot urine sodium and creatinine -NS 259ml/hr -morphine 1-2mg  q 3hrs PRN pain -zofran for nausea -senokot bid -urine culture pending -tamsulosin 0.4mg  daily -clear liquid diet  2. Elevated transaminases-  -hepatitis panel   3. AKI-likely prerenal; sp elevated and ketones in urine  -spot urine sodium and creatinine    Dispo: Disposition is deferred at this time, awaiting improvement of current medical problems. Anticipated discharge in approximately 1-2 day(s).   The patient does not have a current PCP (No Pcp Per Patient) and does need an [facility] hospital follow-up  appointment after discharge.   Signed: Boykin Peek, MD 06/10/2013, 4:10 PM

## 2013-06-10 NOTE — Progress Notes (Addendum)
Received pt assignment from charge nurse ([id number]) at 14:56. (She said the bed request was 21 min old at that time.) Called ED for report at 1502. On hold until 1504. ED RN Leotis Shames) said she needed to call me back. Report was called about 5 minutes later, and the pt arrived to the unit at 1551.

## 2013-06-10 NOTE — ED Notes (Signed)
Report received from Diona Foley, RN. Pt being transferred via Fayrene Fearing, EMT

## 2013-06-10 NOTE — ED Notes (Signed)
Pt refused to have x-ray. Provider informed.

## 2013-06-10 NOTE — Progress Notes (Signed)
Pt admitted to the unit at 1551. Pt mental status is A&Ox4. Pt oriented to room, staff, and call bell. Skin is intact. Full assessment charted in [facility]. Call bell within reach. Visitor guidelines reviewed w/ pt and/or family.

## 2013-06-10 NOTE — ED Notes (Signed)
Pt here on Saturday and complains continued pain since he ran out of the 6 pills we gave him, pt sts dx on Saturday was kidney stones and adrenal mass.

## 2013-06-10 NOTE — ED Provider Notes (Signed)
CSN: [id number]     Arrival date & time 06/10/13  7829 History   First MD Initiated Contact with Patient 06/10/13 [phone]     Chief Complaint  Patient presents with  . Abdominal Pain   (Consider location/radiation/quality/duration/timing/severity/associated sxs/prior Treatment) The history is provided by the patient. No language interpreter was used.  [name] is a 47 y/o M presenting to the ED for no relief of pain from kidney stones. Patient was seen and assessed by this provider on Saturday, 06/08/2013, and was diagnosed with left nephrolithiasis. Patient presenting to the ED today with no relief, patient reported that the pain has gotten worse, a sharp pain to the LLQ. Patient reported that the pain is constant. Reported that he has been taking the medications that were given to him, reported that the nausea was under control, but the pain medications did not control the pain. Patient reported that he spoke with the Urologist yesterday, stated that the Urologist looked at the scan and reported that it is too late for lithotripsy, that the stone is on its way out. Patient reported that he cannot tolerate the pain anymore. Stated that when he has exacerbations of pain he finds it difficult to breath. Reported that he is feeling hot. Denied vomiting, numbness, tingling, headache, hematuria, urinary symptoms, changes to bowel movements, chest pain, shortness of breath.  PCP none    Past Medical History  Diagnosis Date  . Kidney stones    Past Surgical History  Procedure Laterality Date  . Cholecystectomy     History reviewed. No pertinent family history. History  Substance Use Topics  . Smoking status: Never Smoker   . Smokeless tobacco: Not on file  . Alcohol Use: No    Review of Systems  Constitutional: Negative for fever and chills.  HENT: Negative for neck pain and neck stiffness.   Respiratory: Negative for chest tightness.   Cardiovascular: Negative for chest pain.   Gastrointestinal: Positive for nausea and abdominal pain. Negative for vomiting.  Genitourinary: Positive for flank pain. Negative for decreased urine volume and difficulty urinating.  Neurological: Negative for dizziness, weakness and light-headedness.  All other systems reviewed and are negative.    Allergies  Codeine  Home Medications   Current Outpatient Rx  Name  Route  Sig  Dispense  Refill  . naproxen sodium (ANAPROX) 220 MG tablet   Oral   Take 220 mg by mouth every 12 (twelve) hours as needed (for pain).         . ondansetron (ZOFRAN) 4 MG tablet   Oral   Take 1 tablet (4 mg total) by mouth every 6 (six) hours.   12 tablet   0   . oxyCODONE-acetaminophen (PERCOCET/ROXICET) 5-325 MG per tablet   Oral   Take 1 tablet by mouth every 4 (four) hours as needed for pain.   6 tablet   0   . tamsulosin (FLOMAX) 0.4 MG CAPS capsule   Oral   Take 1 capsule (0.4 mg total) by mouth daily.   15 capsule   0    BP 125/80  Pulse 68  Temp(Src) 97.8 F (36.6 C) (Oral)  Resp 20  SpO2 100% Physical Exam  Nursing note and vitals reviewed. Constitutional: He is oriented to person, place, and time. He appears well-developed and well-nourished. No distress.  Patient laying in bed, in fetal position, patient tearful throughout exam and interview  HENT:  Head: Normocephalic and atraumatic.  Dry mucus membranes  Eyes: Conjunctivae and  EOM are normal. Pupils are equal, round, and reactive to light. Right eye exhibits no discharge. Left eye exhibits no discharge.  Neck: Normal range of motion. Neck supple.  Negative neck stiffness Negative nuchal rigidity Negative pain upon cervical spine Negative lymphadenopathy  Cardiovascular: Regular rhythm and normal heart sounds.  Exam reveals no friction rub.   No murmur heard. Pulses:      Radial pulses are 2+ on the right side, and 2+ on the left side.       Dorsalis pedis pulses are 2+ on the right side, and 2+ on the left side.   Mild tachycardia noted  Pulmonary/Chest: Effort normal and breath sounds normal. No respiratory distress. He has no wheezes. He has no rales.  Abdominal: Soft. Bowel sounds are normal. He exhibits no distension. There is tenderness. There is no rebound.  Positive CVA tenderness to the left flank Pain with palpation to the left side of the of abdomen, lower quadrant  Lymphadenopathy:    He has no cervical adenopathy.  Neurological: He is alert and oriented to person, place, and time. He exhibits normal muscle tone. Coordination normal.  Skin: Skin is warm and dry. No rash noted. He is not diaphoretic. No erythema.  Psychiatric: He has a normal mood and affect. His behavior is normal. Thought content normal.    ED Course  Procedures (including critical care time)  Patient was seen and assessed by this provider on Saturday, 06/08/2013, regarding left sided flank pain. Patient was worked up and CT scan of abdomen and pelvis was performed with findings of a left adrenal mass with 3 mm calculus located in the left ureterovesical region of kidney. Patient given Dilaudid 3 mg total, Zofran, and fluids. Patient given pain medications, antiemetics, and Tamsulosin - patient presenting back to the ED with increased pain and pain not controlled. Patient reported that he spoke with a Urologist yesterday, 06/09/2013, stating that the urologist looked at his scan and reported that the stone would pass on it's own.   10:49 AM Re-assessed patient. Patient has legs flexed and bent while laying in bed. Patient reported that the pain has come back, reported that the pain is an 8/10. Reported that he is feeling nauseous.   10:50 AM Spoke with Dr. Radford Pax regarding case - agreed with basic labs being drawn. Recommended to get KUB to see status of the stone. Recommended that Urology be called regarding failure to treat as outpatient.   11:08 AM RN spoke with this provider reporting that patient was refusing KUB film to  be performed, reported that he does not need to be exposed to the radiation and that he does not want to move because the pain will increase. This provider went to go and discuss with patient regarding purpose for scan, patient continued to refuse. KUB discontinued.   12:00 PM Went over labs with Dr. Radford Pax - when compared to the labs from Saturday 06/08/2013 there has been an increase in Creatinine from 0.94 to 1.60. There has also been a decent increase in LFTs and Alk phos - AST from 22 to 121, ALT from 37 to 202, and Alk phos from 83 to 129. Discussed these lab results with Dr. Radford Pax, Dr. Radford Pax sent out a call to Dr. Margarita Grizzle who is currently in surgery until 2:00PM. Dr. Radford Pax and this provider discussed, both agreed to admission of patient.   12:15 PM Spoke with patient and father regarding lab findings and concern for elevated LFTs. Patient reported that he  only had one episode of emesis on Saturday. Patient reported that when he spoke to Urology on Sunday that the Urologist recommended the patient to take 2 Tamsulosin pills because the stone was close to the junction, as well as 2 Aleves.  12:39 PM Spoke with Dr. Modena Jansky, [facility], they are to come and see patient.  2:34 PM Dr. Lourena Simmonds spoke with Dr. Margarita Grizzle, urologist, Dr. Margarita Grizzle to come see patient. Dr. Margarita Grizzle reported that patient needs to be NPO and recommended KUB film to be performed. NPO order placed. This provider went to discuss the recommendation of the KUB from Dr. Margarita Grizzle, patient refused prior to, patient agreed this time - order placed.     Labs Review Labs Reviewed  URINALYSIS, ROUTINE W REFLEX MICROSCOPIC - Abnormal; Notable for the following:    Color, Urine AMBER (*)    Bilirubin Urine SMALL (*)    Ketones, ur 15 (*)    All other components within normal limits  CBC WITH DIFFERENTIAL - Abnormal; Notable for the following:    HCT 38.5 (*)    MCHC 36.4 (*)    Platelets 140 (*)     Neutrophils Relative % 82 (*)    Lymphocytes Relative 10 (*)    All other components within normal limits  COMPREHENSIVE METABOLIC PANEL - Abnormal; Notable for the following:    Glucose, Bld 119 (*)    Creatinine, Ser 1.60 (*)    AST 121 (*)    ALT 202 (*)    Alkaline Phosphatase 129 (*)    GFR calc non Af Amer 51 (*)    GFR calc Af Amer 59 (*)    All other components within normal limits  SALICYLATE LEVEL - Abnormal; Notable for the following:    Salicylate Lvl 0.0 (*)    All other components within normal limits  URINE CULTURE  ACETAMINOPHEN LEVEL   Imaging Review Ct Abdomen Pelvis W Contrast  06/08/2013   CLINICAL DATA:  Abdominal pain  EXAM: CT ABDOMEN AND PELVIS WITH CONTRAST  TECHNIQUE: Multidetector CT imaging of the abdomen and pelvis was performed using the standard protocol following bolus administration of intravenous contrast. Oral contrast was also administered.  CONTRAST:  OMNIPAQUE IOHEXOL 300 MG/ML  SOLN  COMPARISON:  May 05, 2009  FINDINGS: There is mild bibasilar lung atelectasis. Lung bases are otherwise clear. There is a small hiatal hernia.  The liver is enlarged, measuring 20.3 cm in length. There is fatty infiltration in the liver. No focal liver lesions are identified. There is no biliary duct dilatation. Gallbladder is absent.  Spleen, pancreas and right adrenal appear normal. There is a 7 x 7 mm mass in the left adrenal gland.  The right kidney contains a 2 mm calculus in the upper to midpole region, nonobstructing. There is no mass or hydronephrosis on the right. There is perinephric fluid on the left with moderate hydronephrosis of the left kidney. There is no intrarenal mass or calculus. There is a 3 mm calculus at the left ureterovesical junction causing hydronephrosis on the left. Note that there is some fluid tracking along the proximal left ureter, probably due to urine extravasation proximally.  In the pelvis, there is no mass or fluid collection.  Appendix appears normal.  There is no bowel obstruction. No free air or portal venous air.  There is no ascites, adenopathy, or abscess in the abdomen or pelvis. Aorta is non aneurysmal. There are no blastic or lytic bone lesions. There  is degenerative change at L5-S1.  IMPRESSION: 3 mm calculus at the left ureterovesical junction causing moderate hydronephrosis on the left. There is left-sided perinephric fluid as well as urine tracking along the proximal left ureter.  Small nonobstructing calculus in right kidney.  The gallbladder is absent.  The liver is enlarged with fatty infiltration.  Small hiatal hernia.  No bowel obstruction. No abscess.  Subcentimeter left adrenal mass. Particular attention to this area on subsequent evaluations is advised.   Electronically Signed   By: Bretta Bang   On: 06/08/2013 14:29    MDM   1. Nephrolithiasis   2. Elevated LFTs     Patient presenting to the ED with left sided abdominal and flank pain that has been consistent since Saturday, 06/08/2013. Patient was seen and assessed by this provider on 06/08/2013 where a CT scan of abdomen and pelvis with contrast was performed - 3 mm calculus in the left ureterovesical junction with moderate hydronephrosis was diagnosed. Negative acute abdominal processes. Patient reported that the pain medications there were given did not help, nausea was controlled. Reported that he spoke with the Urologist yesterday, 06/09/2013, and reported that the urologist stated that it was too late for a procedure to be performed since the stone is most likely going to pass.  Alert and oriented. Dry mucus membranes. Heart rate and rhythm normal. Lungs clear to auscultation. BS normoactive in all 4 quadrants. Pain with palpation to the LLQ and left groin region. Positive left sided CVA tenderness. Pulses palpable and strong, distal and proximal.  CBC negative elevated WBC count. Urine negative for infection. CMP noted elevation in LFTs, when  compared to the labs from Saturday 06/08/2013 there has been an increase in Creatinine from 0.94 to 1.60. There has also been a decent increase in LFTs and Alk phos - AST from 22 to 121, ALT from 37 to 202, and Alk phos from 83 to 129. APAP and salicylate levels with negative elevation. Discussed findings with Dr. Lourena Simmonds who recommended patient to be admitted to the hospital. Increase in LFTs over 2 day time span, unknown etiology. Discussed case with Dr. Modena Jansky of [facility] Medicine Teaching Services - patient to be admitted. Dr. Lourena Simmonds spoke with Dr. Margarita Grizzle, Urologist, Dr. Margarita Grizzle to see patient - physician recommended KUB which has been ordered.  Discussed concern of elevated LFTs and patient being dehydrated in great length with patient, patient understood. Discussed plan for admission - patient understood and agreed to plan.     Raymon Mutton, PA-C 06/12/13 1205

## 2013-06-10 NOTE — Consult Note (Signed)
Urology Consult  Requesting provider:  Dr. Radford Pax  CC: Left ureter stone.  HPI:  47 year old male presents to the ER today for abdominal pain. He presented to the ER 06/08/13 with left flank pain. He has a history of kidney stones. A CT scan which revealed a left distal ureter stone. This is 3 mm in size. It was associated with proximal hydroureter and perinephric fluid. UA did not look concerning for infection. He was discharged home with Flomax and pain medication followup as an outpatient. He returned today with continued flank pain. He states he did not have enough vein medication to take at home. While in the ER he was found to have significant transaminitis due to an unknown source. This may be due to overdose of Tylenol. He states his pain is being controlled for the past 3 hours with IV pain medication.  Today we discussed management of stones including medical expulsive therapy, cystoscopy and ureteroscopy, shockwave lithotripsy. We discussed the risk and benefits, and alternatives of each of these options. I do not recommend shockwave lithotripsy for distal stone such as this. I would recommend trying to pass the stone on his own. He did not wish to do this cystoscopy and ureteroscopy would have risks which include but are not to bleeding, infection, allergic reaction, heart attack, stroke, death, ureter flushing, ureter stricture, ureteral laceration, failure to remove stone, in need for ureter stent.  We discussed findings and CT. I explained that he had a right nonobstructing renal stone. I explained that he likely had a forniceal rupture resulting in the fluid around his kidney. Since his urine did not look infected this was not concerning. I do not recommend treatment for this unless he spikes fever or appears to have infection. He currently has urine culture pending.  PMH: Past Medical History  Diagnosis Date  . Kidney stones     PSH: Past Surgical History  Procedure Laterality  Date  . Cholecystectomy      Allergies: Allergies  Allergen Reactions  . Codeine Nausea Only    Medications:  (Not in a hospital admission)   Social History: History   Social History  . Marital Status: Divorced    Spouse Name: N/A    Number of Children: N/A  . Years of Education: N/A   Occupational History  . Not on file.   Social History Main Topics  . Smoking status: Never Smoker   . Smokeless tobacco: Not on file  . Alcohol Use: No  . Drug Use: No  . Sexual Activity:    Other Topics Concern  . Not on file   Social History Narrative  . No narrative on file    Family History: History reviewed. No pertinent family history.  Review of Systems: Positive: Left flank pain, minimal nausea. Negative: Fever, chest pain, or SOB.  A further 10 point review of systems was negative except what is listed in the HPI.  Physical Exam: Filed Vitals:   06/10/13 1256  BP: 125/80  Pulse: 68  Temp:   Resp: 20    General: No acute distress.  Awake. Head:  Normocephalic.  Atraumatic. ENT:  EOMI.  Mucous membranes moist Neck:  Supple.  No lymphadenopathy. CV:  S1 present. S2 present. Regular rate. Pulmonary: Equal effort bilaterally.  Clear to auscultation bilaterally. Abdomen: Soft.  Non- tender to palpation. Skin:  Normal turgor.  No visible rash. Extremity: No gross deformity of bilateral upper extremities.  No gross deformity of    bilateral lower  extremities. Neurologic: Alert. Appropriate mood.   Studies:  Recent Labs     06/08/13  1012  06/10/13  1105  HGB  14.9  14.0  WBC  6.9  7.8  PLT  172  140*    Recent Labs     06/08/13  1012  06/10/13  1105  NA  136  136  K  3.7  3.7  CL  100  101  CO2  25  27  BUN  16  17  CREATININE  0.94  1.60*  CALCIUM  9.3  8.9  GFRNONAA  >90  51*  GFRAA  >90  59*     No results found for this basename: PT, INR, APTT,  in the last 72 hours   No components found with this basename: ABG,     Assessment:   Left ureter stone.  Plan: -Admit to medicine for hepatitis.  -Patient would like to try to pass the stone on his own.  -Continue flomax BID.  -Strain all urine.  -OK to eat now. Make NPO at midnight.  -Recheck KUB in the morning.    Pager: [phone]    CC: Dr. Radford Pax

## 2013-06-11 ENCOUNTER — Other Ambulatory Visit (HOSPITAL_COMMUNITY): Payer: PRIVATE HEALTH INSURANCE

## 2013-06-11 ENCOUNTER — Observation Stay (HOSPITAL_COMMUNITY): Payer: PRIVATE HEALTH INSURANCE

## 2013-06-11 LAB — PROTIME-INR: INR: 1.09 (ref 0.00–1.49)

## 2013-06-11 LAB — COMPREHENSIVE METABOLIC PANEL
Albumin: 2.8 g/dL — ABNORMAL LOW (ref 3.5–5.2)
Alkaline Phosphatase: 145 U/L — ABNORMAL HIGH (ref 39–117)
BUN: 12 mg/dL (ref 6–23)
Calcium: 8.3 mg/dL — ABNORMAL LOW (ref 8.4–10.5)
Chloride: 104 mEq/L (ref 96–112)
Creatinine, Ser: 1.05 mg/dL (ref 0.50–1.35)
Potassium: 3.6 mEq/L (ref 3.5–5.1)
Total Bilirubin: 1 mg/dL (ref 0.3–1.2)
Total Protein: 5.6 g/dL — ABNORMAL LOW (ref 6.0–8.3)

## 2013-06-11 LAB — URINE CULTURE
Colony Count: NO GROWTH
Culture: NO GROWTH
Special Requests: NORMAL

## 2013-06-11 LAB — HEPATITIS PANEL, ACUTE
HCV Ab: NEGATIVE
Hep B C IgM: NEGATIVE
Hepatitis B Surface Ag: NEGATIVE

## 2013-06-11 LAB — CBC
HCT: 35.4 % — ABNORMAL LOW (ref 39.0–52.0)
Hemoglobin: 12.4 g/dL — ABNORMAL LOW (ref 13.0–17.0)
MCH: 29.5 pg (ref 26.0–34.0)
MCV: 84.1 fL (ref 78.0–100.0)
RBC: 4.21 MIL/uL — ABNORMAL LOW (ref 4.22–5.81)
RDW: 12.3 % (ref 11.5–15.5)
WBC: 5.1 10*3/uL (ref 4.0–10.5)

## 2013-06-11 LAB — SALICYLATE LEVEL: Salicylate Lvl: <2 mg/dL — ABNORMAL LOW (ref 2.8–20.0)

## 2013-06-11 NOTE — Progress Notes (Signed)
Patient keeps stating that he is leaving at 1pm today against medical advice. Dr Delane Ginger of teaching service was notified. Charge Nurse was notified and spoke with patient. Patient agreed to stay a little longer, but he was not staying all afternoon waiting on the dr. Stann Mainland continue to monitor situation. Offered patient a shower, he refused. Madelin Rear, MSN, RN, [employer]

## 2013-06-11 NOTE — Discharge Summary (Signed)
Name: [name] MRN: [id number] DOB: 08-Dec-1965 47 y.o. PCP: No Pcp Per Patient  Date of Admission: 06/10/2013  8:11 AM Date of Discharge: 06/11/2013 Attending Physician: Dr. Inez Catalina, MD   Discharge Diagnosis: Principal Problem:   Acute kidney injury Active Problems:   Kidney stone on left side   Elevated liver function tests  Discharge Medications:   Medication List    STOP taking these medications       naproxen sodium 220 MG tablet  Commonly known as:  ANAPROX     ondansetron 4 MG tablet  Commonly known as:  ZOFRAN     oxyCODONE-acetaminophen 5-325 MG per tablet  Commonly known as:  PERCOCET/ROXICET     tamsulosin 0.4 MG Caps capsule  Commonly known as:  FLOMAX        Disposition and follow-up:   [name] was discharged from [facility] in Good condition.  At the hospital follow up visit please address:  1.  Elevated transaminases   2.  Labs / imaging needed at time of follow-up: LFT's  3.  Pending labs/ test needing follow-up: Hepatitis panel  Follow-up Appointments:     Follow-up Information   Follow up with Dow Adolph, MD On 06/25/2013. (2:15pm)    Specialty:  Internal Medicine   Contact information:   [address] [place] [address] [phone]       Call Milford Cage, MD. (Make a follow up appointment in 3 months)    Specialty:  Urology   Contact information:   [address] [place] [address]LO[address] [place], [place] [address] [phone]       Discharge Instructions: Discharge Orders   Future Appointments Provider Department Dept Phone   06/25/2013 2:15 PM Dow Adolph, MD [facility] [phone]   Future Orders Complete By Expires   Call MD for:  persistant nausea and vomiting  As directed    Call MD for:  severe uncontrolled pain  As directed    Diet - low sodium heart healthy  As directed    Increase activity slowly  As  directed       Consultations:  Urology  Procedures Performed:  Dg Chest 2 View  06/08/2013   *RADIOLOGY REPORT*  Clinical Data: Abdomen pain, nausea, vomiting  CHEST - 2 VIEW  Comparison: August 24, 2011  Findings: There is no focal infiltrate, pulmonary edema, or pleural effusion.  The mediastinal contour and cardiac silhouette are stable. The soft tissues and osseous structures are stable. Surgical clips are noted in the upper abdomen.  IMPRESSION: No acute cardiopulmonary disease identified.   Original Report Authenticated By: Sherian Rein, M.D.   Dg Abd 1 View  06/10/2013   CLINICAL DATA:  47 year old male with abdominal pain. History of obstructing left ureteral calculus.  EXAM: ABDOMEN - 1 VIEW  COMPARISON:  Interbody T9 2014.  FINDINGS: Non obstructed bowel gas pattern. Oral contrast now on the right and proximal transverse colon. Punctate right renal calculus not identified.  Small distal obstructing left ureteral calculus which was at the left ureterovesical junction at the time of the comparison may be identified over the bladder (arrow). The bladder is mildly more distended. No other urologic calculus identified. No acute osseous abnormality identified.  IMPRESSION: Possible visualization of the distal obstructing left UVJ calculus as seen on 05/2013, currently projecting over the bladder.   Electronically Signed   By: Augusto Gamble M.D.   On:  06/10/2013 15:08   US Abdomen Complete  06/11/2013   CLINICAL DATA:  Abdominal pain. Elevated liver enzymes. History of a cholecystectomy. History of kidney stones.  EXAM: ABDOMEN ULTRASOUND  COMPARISON:  CT, 05/2013.  FINDINGS: Gallbladder  Surgically absent  Common bile duct  Diameter: 3 mm. No evidence of a duct stone.  Liver  Echogenic portal triads. This is nonspecific. It can be seen with hepatic inflammation. Liver is normal in size. No sonographic evidence of fatty infiltration. No liver mass or focal lesion. Hepatopetal flow was documented in the  portal vein.  IVC  No abnormality visualized.  Pancreas  Visualized portion unremarkable.  Spleen  Size and appearance within normal limits.  Right Kidney  Length: Echogenic focus in the upper pole consistent with a nonobstructing intrarenal stone, consistent with a stones seen on CT. Kidney measures 12.3 cm in length. No renal masses. No hydronephrosis.  Left Kidney  Length: 12.7 cm. Echogenicity within normal limits. No mass or hydronephrosis visualized.  Abdominal aorta  No aneurysm visualized.  Small right pleural effusion.  IMPRESSION: The  1. Echogenic portal triads in the liver. This is nonspecific but can be seen with hepatic inflammation. No other liver abnormality. 2. Nonobstructing stone in the upper pole of the right kidney. No hydronephrosis. 3. Status post cholecystectomy. 4. Small right pleural effusion. No other abnormalities.   Electronically Signed   By: Amie Portland   On: 06/11/2013 09:32   Ct Abdomen Pelvis W Contrast  06/08/2013   CLINICAL DATA:  Abdominal pain  EXAM: CT ABDOMEN AND PELVIS WITH CONTRAST  TECHNIQUE: Multidetector CT imaging of the abdomen and pelvis was performed using the standard protocol following bolus administration of intravenous contrast. Oral contrast was also administered.  CONTRAST:  OMNIPAQUE IOHEXOL 300 MG/ML  SOLN  COMPARISON:  May 05, 2009  FINDINGS: There is mild bibasilar lung atelectasis. Lung bases are otherwise clear. There is a small hiatal hernia.  The liver is enlarged, measuring 20.3 cm in length. There is fatty infiltration in the liver. No focal liver lesions are identified. There is no biliary duct dilatation. Gallbladder is absent.  Spleen, pancreas and right adrenal appear normal. There is a 7 x 7 mm mass in the left adrenal gland.  The right kidney contains a 2 mm calculus in the upper to midpole region, nonobstructing. There is no mass or hydronephrosis on the right. There is perinephric fluid on the left with moderate hydronephrosis of  the left kidney. There is no intrarenal mass or calculus. There is a 3 mm calculus at the left ureterovesical junction causing hydronephrosis on the left. Note that there is some fluid tracking along the proximal left ureter, probably due to urine extravasation proximally.  In the pelvis, there is no mass or fluid collection. Appendix appears normal.  There is no bowel obstruction. No free air or portal venous air.  There is no ascites, adenopathy, or abscess in the abdomen or pelvis. Aorta is non aneurysmal. There are no blastic or lytic bone lesions. There is degenerative change at L5-S1.  IMPRESSION: 3 mm calculus at the left ureterovesical junction causing moderate hydronephrosis on the left. There is left-sided perinephric fluid as well as urine tracking along the proximal left ureter.  Small nonobstructing calculus in right kidney.  The gallbladder is absent.  The liver is enlarged with fatty infiltration.  Small hiatal hernia.  No bowel obstruction. No abscess.  Subcentimeter left adrenal mass. Particular attention to this area on subsequent evaluations is  advised.   Electronically Signed   By: Bretta Bang   On: 06/08/2013 14:29    2D Echo: None  Cardiac Cath: None  Admission HPI: Pt is a 47 yo CM with a PMH of nephrolithiasis who presents to the ED with LLQ abdominal pain since Saturday. He came to the ED on Saturday with similar pain and was diagnosed with nephrolithiasis via CT of Abd/Pelvis w/contrast. He had a right kidney calculus of 2mm in the upper/midpole that was non-obstructing with no associated hydronephrosis. There was also a 3mm calculus at the left ureterovesical junction with associated hydronephrosis. He was told to drink plenty of fluids and given zofran, percocet, and flomax in the ED on Saturday and was given a referral to a urologist. Unfortunately, he continued to have pain despite taking pain medicine and came back to the ED today. He reports pain localized to the LLQ and  non-radiating. He denies any fevers but does report chills. He reports some associated nausea but denies emesis. He denies any hematuria or dysuria. He reports the pain feels similar to the pain when he had his gallbladder removed.    Hospital Course by problem list: Principal Problem:   Acute kidney injury- Pt was admitted with LLQ abdominal pain with an elevated creatinine from 0.94 on 9/20 to 1.60 on 9/22. Urinalysis shows increased sp and ketones but negative for nitrites or leukocytes. Most likely etiology was prerenal given increased sp and ketones. A urine culture showed no growth. Upon discharge, creatinine had trended down to 1.05.  Active Problems:   Kidney stone on left side- Pt presented with worsening LLQ abdominal pain since Saturday when he was diagnosed with nephrolithiasis in the ED and sent home with pain meds, flomax, and instructions to increase his fluid intake. His pain was not well controlled with the pain meds and he returned to the ED today. He has experienced nephrolithiasis in the past. He denies any hematuria or dysuria but does report some nausea but no emesis. CT scan on 9/20 shows an enlarged liver measuring 20.3cm in length with a 7x24mm left adrenal gland mass. The right kidney contains a 2 mm calculus in the upper pole which is nonobstructing. There is a 3mm calculus at the left ureterovesical junction causing hydronephrosis on the left with some fluid tracking along the proximal left ureter. Additionally, creatinine is elevated to 1.60 from 0.94 in two days. UA showed increased sp and ketones but no nitrites or leukocytes. Additionally, transaminases were elevated from two days ago for unknown reasons. Urology was consulted and recommended flomax twice daily and to strain urine as pt would like to try to pass stone on his own. He was given NS 221ml/hr, morphine PRN for pain, and zofran for nausea. He was started on a clear liquid diet. Overnight pt was able to pass the stone  and was sent to the [facility] lab for chemical analysis. The following morning the patient was pain free and creatinine had trended down to 1.05. He was discharged in good condition to follow up with urology in 3 months.    Elevated liver function tests- Pt had previously normal ALT, AST, ALP on 9/20 but were elevated on 9/22. An APAP level was normal. An abdominal US showed echogenic portal triads in the liver which is nonspecific but can be seen with hepatic inflammation; there is a nonobstructing stone in the upper pole of the right kidney with no hydronephrosis; he is s/p cholecystectomy. A hepatitis panel is pending. The  pt reports no h/o IV drug use, alcohol use, recent foreign travel, or other risk factors for hepatitis. The etiology of the elevation remains unknown. Pt is to follow up in the [facility] for repeat LFT's.     Discharge Vitals:   BP 148/89  Pulse 58  Temp(Src) 98.1 F (36.7 C) (Oral)  Resp 16  Ht 5\' 11"  (1.803 m)  SpO2 95%  Discharge Labs:  Results for orders placed during the hospital encounter of 06/10/13 (from the past 24 hour(s))  COMPREHENSIVE METABOLIC PANEL     Status: Abnormal   Collection Time    06/11/13  4:25 AM      Result Value Range   Sodium 139  135 - 145 mEq/L   Potassium 3.6  3.5 - 5.1 mEq/L   Chloride 104  96 - 112 mEq/L   CO2 27  19 - 32 mEq/L   Glucose, Bld 100 (*) 70 - 99 mg/dL   BUN 12  6 - 23 mg/dL   Creatinine, Ser 5.28  0.50 - 1.35 mg/dL   Calcium 8.3 (*) 8.4 - 10.5 mg/dL   Total Protein 5.6 (*) 6.0 - 8.3 g/dL   Albumin 2.8 (*) 3.5 - 5.2 g/dL   AST 413 (*) 0 - 37 U/L   ALT 194 (*) 0 - 53 U/L   Alkaline Phosphatase 145 (*) 39 - 117 U/L   Total Bilirubin 1.0  0.3 - 1.2 mg/dL   GFR calc non Af Amer 84 (*) >90 mL/min   GFR calc Af Amer >90  >90 mL/min  CBC     Status: Abnormal   Collection Time    06/11/13  4:25 AM      Result Value Range   WBC 5.1  4.0 - 10.5 K/uL   RBC 4.21 (*) 4.22 - 5.81 MIL/uL   Hemoglobin 12.4 (*) 13.0 - 17.0 g/dL   HCT  24.4 (*) 01.0 - 52.0 %   MCV 84.1  78.0 - 100.0 fL   MCH 29.5  26.0 - 34.0 pg   MCHC 35.0  30.0 - 36.0 g/dL   RDW 27.2  53.6 - 64.4 %   Platelets 141 (*) 150 - 400 K/uL  PROTIME-INR     Status: None   Collection Time    06/11/13  4:25 AM      Result Value Range   Prothrombin Time 13.9  11.6 - 15.2 seconds   INR 1.09  0.00 - 1.49  HEPATITIS PANEL, ACUTE     Status: None   Collection Time    06/11/13  4:25 AM      Result Value Range   Hepatitis B Surface Ag NEGATIVE  NEGATIVE   HCV Ab NEGATIVE  NEGATIVE   Hep A IgM PENDING  NEGATIVE   Hep B C IgM PENDING  NEGATIVE    Signed: Boykin Peek, MD 06/11/2013, 3:03 PM   Time Spent on Discharge: 50 minutes Services Ordered on Discharge: None Equipment Ordered on Discharge: None

## 2013-06-11 NOTE — H&P (Addendum)
  Date: 06/11/2013  Patient name: [name]  Medical record number: [id number]  Date of birth: 1966-06-08   I have seen and evaluated Frederich Chick and discussed their care with the Residency Team.   Assessment and Plan: I have seen and evaluated the patient as outlined above. I agree with the formulated Assessment and Plan as detailed in the residents' admission note, with the following changes:   1. Nephrolithiasis: Urology has been consulted and preference is for attempt to pass stone.  Renal ultrasound, tamsulosin, fluids at 200cc/hr and clear liquid diet.  NPO p MN in case of procedure.  Morphine PRN for pain, zofran prn for nausea.  Have sent a urine culture as well.     2. Transaminitis: unclear etiology, he has a fatty infiltration of the liver on CT scan which could be the cause.  He has a history of GB disease and cholecystectomy, could be retained stone?  Will check hepatitis panel and abdominal US.  He has had some nausea, but this has been attributed to the pain from the kidney stone.  He was taking percocet, but not at high enough doses to cause liver injury, though this dose may cause a transient rise (< 15 ULN).  Will follow up on results of labs and imaging.  Tylenol level was low.  Magnitude of elevation makes an acute hepatitis unlikely, possibly due to a chronic infection or NAFLD.    3. AKI: Improved with fluids, continue to monitor closely while awaiting stone passage.   Full Code  Inez Catalina, MD 9/23/201410:06 AM

## 2013-06-11 NOTE — Progress Notes (Signed)
Subjective:  Pt passed the kidney stone last night and is being sent for analysis. Pt reports feeling good this morning and denies any further episodes of LLQ abdominal pain.  Objective: Vital signs in last 24 hours: Filed Vitals:   06/10/13 1256 06/10/13 1644 06/10/13 2355 06/11/13 0445  BP: 125/80 131/86 131/83 148/89  Pulse: 68 63 58 58  Temp:  99.1 F (37.3 C) 98.3 F (36.8 C) 98.1 F (36.7 C)  TempSrc:   Oral Oral  Resp: 20 19 18 16   Height:  5\' 11"  (1.803 m)    SpO2: 100% 100% 97% 95%   Weight change:   Intake/Output Summary (Last 24 hours) at 06/11/13 1142 Last data filed at 06/11/13 0725  Gross per 24 hour  Intake 1343.33 ml  Output    450 ml  Net 893.33 ml    Constitutional: Vital signs reviewed.  Patient is a well-developed and well-nourished male in no acute distress and cooperative with exam.Head: Normocephalic and atraumatic Eyes: PERRL, EOMI, conjunctivae normal  Neck: Supple, Trachea midline .  Cardiovascular: RRR, S1 normal, S2 normal, no MRG, pulses symmetric and intact bilaterally Pulmonary/Chest: normal respiratory effort, CTAB, no wheezes, rales, or rhonchi Abdominal: Soft. Non-tender, non-distended, bowel sounds are normal, no masses, organomegaly, or guarding present.  Neurological: A&O x3, cranial nerve II-XII are grossly intact, no focal motor deficit Skin: Warm, dry and intact. No rash, cyanosis, or clubbing.  Psychiatric: Normal mood and affect.   Lab Results: Basic Metabolic Panel:  Recent Labs Lab 06/10/13 1105 06/11/13 0425  NA 136 139  K 3.7 3.6  CL 101 104  CO2 27 27  GLUCOSE 119* 100*  BUN 17 12  CREATININE 1.60* 1.05  CALCIUM 8.9 8.3*   Liver Function Tests:  Recent Labs Lab 06/10/13 1105 06/11/13 0425  AST 121* 113*  ALT 202* 194*  ALKPHOS 129* 145*  BILITOT 0.8 1.0  PROT 6.6 5.6*  ALBUMIN 3.6 2.8*    Recent Labs Lab 06/08/13 1012  LIPASE 23   No results found for this basename: AMMONIA,  in the last 168  hours CBC:  Recent Labs Lab 06/08/13 1012 06/10/13 1105 06/11/13 0425  WBC 6.9 7.8 5.1  NEUTROABS 5.2 6.4  --   HGB 14.9 14.0 12.4*  HCT 42.1 38.5* 35.4*  MCV 83.5 83.3 84.1  PLT 172 140* 141*   Cardiac Enzymes:  Recent Labs Lab 06/08/13 1023 06/08/13 1330  TROPONINI <0.30 <0.30   BNP: No results found for this basename: PROBNP,  in the last 168 hours D-Dimer: No results found for this basename: DDIMER,  in the last 168 hours CBG: No results found for this basename: GLUCAP,  in the last 168 hours Hemoglobin A1C: No results found for this basename: HGBA1C,  in the last 168 hours Fasting Lipid Panel: No results found for this basename: CHOL, HDL, LDLCALC, TRIG, CHOLHDL, LDLDIRECT,  in the last 168 hours Thyroid Function Tests: No results found for this basename: TSH, T4TOTAL, FREET4, T3FREE, THYROIDAB,  in the last 168 hours Coagulation:  Recent Labs Lab 06/11/13 0425  LABPROT 13.9  INR 1.09   Anemia Panel: No results found for this basename: VITAMINB12, FOLATE, FERRITIN, TIBC, IRON, RETICCTPCT,  in the last 168 hours Urine Drug Screen: Drugs of Abuse  No results found for this basename: labopia, cocainscrnur, labbenz, amphetmu, thcu, labbarb    Alcohol Level: No results found for this basename: ETH,  in the last 168 hours Urinalysis:  Recent Labs Lab 06/08/13 1446 06/10/13  1128  COLORURINE YELLOW AMBER*  LABSPEC >1.046* 1.026  PHURINE 7.0 5.5  GLUCOSEU NEGATIVE NEGATIVE  HGBUR NEGATIVE NEGATIVE  BILIRUBINUR NEGATIVE SMALL*  KETONESUR 15* 15*  PROTEINUR NEGATIVE NEGATIVE  UROBILINOGEN 0.2 1.0  NITRITE NEGATIVE NEGATIVE  LEUKOCYTESUR NEGATIVE NEGATIVE   Micro Results: No results found for this or any previous visit (from the past 240 hour(s)). Studies/Results: Dg Abd 1 View  06/10/2013   CLINICAL DATA:  47 year old male with abdominal pain. History of obstructing left ureteral calculus.  EXAM: ABDOMEN - 1 VIEW  COMPARISON:  Interbody T9 2014.   FINDINGS: Non obstructed bowel gas pattern. Oral contrast now on the right and proximal transverse colon. Punctate right renal calculus not identified.  Small distal obstructing left ureteral calculus which was at the left ureterovesical junction at the time of the comparison may be identified over the bladder (arrow). The bladder is mildly more distended. No other urologic calculus identified. No acute osseous abnormality identified.  IMPRESSION: Possible visualization of the distal obstructing left UVJ calculus as seen on 05/2013, currently projecting over the bladder.   Electronically Signed   By: Augusto Gamble M.D.   On: 06/10/2013 15:08   US Abdomen Complete  06/11/2013   CLINICAL DATA:  Abdominal pain. Elevated liver enzymes. History of a cholecystectomy. History of kidney stones.  EXAM: ABDOMEN ULTRASOUND  COMPARISON:  CT, 05/2013.  FINDINGS: Gallbladder  Surgically absent  Common bile duct  Diameter: 3 mm. No evidence of a duct stone.  Liver  Echogenic portal triads. This is nonspecific. It can be seen with hepatic inflammation. Liver is normal in size. No sonographic evidence of fatty infiltration. No liver mass or focal lesion. Hepatopetal flow was documented in the portal vein.  IVC  No abnormality visualized.  Pancreas  Visualized portion unremarkable.  Spleen  Size and appearance within normal limits.  Right Kidney  Length: Echogenic focus in the upper pole consistent with a nonobstructing intrarenal stone, consistent with a stones seen on CT. Kidney measures 12.3 cm in length. No renal masses. No hydronephrosis.  Left Kidney  Length: 12.7 cm. Echogenicity within normal limits. No mass or hydronephrosis visualized.  Abdominal aorta  No aneurysm visualized.  Small right pleural effusion.  IMPRESSION: The  1. Echogenic portal triads in the liver. This is nonspecific but can be seen with hepatic inflammation. No other liver abnormality. 2. Nonobstructing stone in the upper pole of the right kidney. No  hydronephrosis. 3. Status post cholecystectomy. 4. Small right pleural effusion. No other abnormalities.   Electronically Signed   By: Amie Portland   On: 06/11/2013 09:32   Medications: I have reviewed the patient's current medications. Scheduled Meds: . heparin  5,000 Units Subcutaneous Q8H  . senna  1 tablet Oral BID  . tamsulosin  0.4 mg Oral BID   Continuous Infusions: . sodium chloride 200 mL/hr at 06/11/13 0727   PRN Meds:.morphine injection, ondansetron (ZOFRAN) IV, ondansetron Assessment/Plan:  1. Abdominal Pain-Pt presents with LLQ abdominal pain since Saturday when he was diagnosed with nephrolithiasis and sent home with pain meds, flomax, and to increase his fluid intake. He has experienced nephrolithiasis in the past. He denies any hematuria or dysuria but does report some nausea but no emesis. CT scan on 9/20 shows an enlarged liver measuring 20.3cm in length with a 7x75mm left adrenal gland mass. The right kidney contains a 2 mm calculus in the upper pole which is nonobstructing. There is a 3mm calculus at the left ureterovesical junction causing hydronephrosis  on the left with some fluid tracking along the proximal left ureter. Most likely etiology is nephrolithiasis. Pt creatinine is elevated to 1.60 from 0.94 in two days. UA shows increased sp and ketones but no nitrites or leukocytes. Additionally, his transaminases are elevated from two days ago for unknown reasons. Urrology was consulted and did not recommend lithotripsy but recommended flomax BID and to strain all urine. Pt subsequently passed the stone last evening and is being sent for analysis. It was recommended that he follow up with urology in 3 months. He was given zofran for nausea and morphine for pain. He was started on fluids at 273ml/hr. PT/INR was normal. CMET revealed decreased protein and albumin with an elevated AST, ALT, and ALP. CBC showed anemia but most likely represents dilutional anemia. His pain has resolved  this morning and would like to go home.  2. Elevated transaminases-hepatitis panel is pending; ordered an abd [place] which showed echogenic portal triads in the liver which may represent hepatic inflammation but is nonspecific. This may be worked up further on an outpatient basis.  3. AKI-Resolved. Likely prerenal; sp elevated and ketones in urine; creatinine today was 1.05.   Dispo:  Anticipated discharge in approximately today.     .Services Needed at time of discharge: Y = Yes, Blank = No PT:   OT:   RN:   Equipment:   Other:     LOS: 1 day   Boykin Peek, MD 06/11/2013, 11:42 AM

## 2013-06-11 NOTE — Progress Notes (Signed)
Results for NASHUA, HOMEWOOD (MRN [id number]) as of 06/11/2013 16:19  Ref. Range 06/10/2013 11:05  Salicylate Lvl Latest Range: 2.8-20.0 mg/dL <4.0 (L)    Dr Delane Ginger notified of the change in results from lab for above values. Madelin Rear, MSN, RN, [employer]

## 2013-06-11 NOTE — Progress Notes (Signed)
NURSING PROGRESS NOTE  [name] [id number] Discharge Data: 06/11/2013 1:59 PM Attending Provider: No att. providers found PCP:No PCP Per Patient     Frederich Chick to be D/C'd Home per MD order.    All IV's discontinued with no bleeding noted.  All belongings returned to patient for patient to take home.   Last Vital Signs:  Blood pressure 148/89, pulse 58, temperature 98.1 F (36.7 C), temperature source Oral, resp. rate 16, height 5\' 11"  (1.803 m), weight 0 kg (0 lb), SpO2 95.00%.  Discharge Medication List   Medication List    STOP taking these medications       naproxen sodium 220 MG tablet  Commonly known as:  ANAPROX     ondansetron 4 MG tablet  Commonly known as:  ZOFRAN     oxyCODONE-acetaminophen 5-325 MG per tablet  Commonly known as:  PERCOCET/ROXICET     tamsulosin 0.4 MG Caps capsule  Commonly known as:  FLOMAX        Madelin Rear, MSN, RN, [employer]

## 2013-06-11 NOTE — Care Management Note (Signed)
    Page 1 of 1   06/11/2013     11:47:11 AM   CARE MANAGEMENT NOTE 06/11/2013  Patient:  [name], [name]   Account Number:  [id number]  Date Initiated:  06/11/2013  Documentation initiated by:  Letha Cape  Subjective/Objective Assessment:   dx acute kidney injury  admit as observation- lives alone. pta indep.     Action/Plan:   Anticipated DC Date:  06/04/2013   Anticipated DC Plan:  HOME/SELF CARE      DC Planning Services  CM consult      Choice offered to / List presented to:             Status of service:  Completed, signed off Medicare Important Message given?   (If response is "NO", the following Medicare IM given date fields will be blank) Date Medicare IM given:   Date Additional Medicare IM given:    Discharge Disposition:  HOME/SELF CARE  Per UR Regulation:  Reviewed for med. necessity/level of care/duration of stay  If discussed at Long Length of Stay Meetings, dates discussed:    Comments:  06/11/13 11:35 Letha Cape RN, BSN [phone] patient lives alone, pta indep.  Patient has medication coverage and transportation at dc.  Patient states he does not have a PCP,  I gave him the [facility] phone number to call so they could help him find a MD in network with his insurance.  Patient states he is ready to go at 12:15 or he is leaving AMA, RN is contacting MD.

## 2013-06-11 NOTE — Progress Notes (Signed)
Urology Progress Note  Subjective:     No acute urologic events overnight. No pain overnight. No pain now. Urine strained and the stone was passed.  ROS: Negative: chest pain or SOB.  Objective:  Patient Vitals for the past 24 hrs:  BP Temp Temp src Pulse Resp SpO2 Height Weight  06/11/13 0445 148/89 mmHg 98.1 F (36.7 C) Oral 58 16 95 % - -  06/10/13 2355 131/83 mmHg 98.3 F (36.8 C) Oral 58 18 97 % - -  06/10/13 1644 131/86 mmHg 99.1 F (37.3 C) - 63 19 100 % 5\' 11"  (1.803 m) -  06/10/13 1256 125/80 mmHg - - 68 20 100 % - -  06/10/13 1200 118/74 mmHg - - 68 - 91 % - -  06/10/13 1145 117/71 mmHg - - 64 - 91 % - -  06/10/13 1130 119/80 mmHg - - 67 - 92 % - -  06/10/13 1117 119/80 mmHg - - 62 20 98 % - -  06/10/13 0826 118/81 mmHg 97.8 F (36.6 C) Oral 66 18 98 % - -    Physical Exam: General:  No acute distress, awake Cardiovascular:    [x]   S1/S2 present, RRR  []   Irregularly irregular Chest:  CTA-B Abdomen:               []  Soft, appropriately TTP  [x]  Soft, NTTP  []  Soft, appropriately TTP, incision(s) clean/dry/intact  Genitourinary: Negative edema. Negative foley.        Recent Labs     06/10/13  1105  06/11/13  0425  HGB  14.0  12.4*  WBC  7.8  5.1  PLT  140*  141*    Recent Labs     06/10/13  1105  06/11/13  0425  NA  136  139  K  3.7  3.6  CL  101  104  CO2  27  27  BUN  17  12  CREATININE  1.60*  1.05  CALCIUM  8.9  8.3*  GFRNONAA  51*  84*  GFRAA  59*  >90     Recent Labs     06/11/13  0425  INR  1.09     No components found with this basename: ABG,     Length of stay: 1 days.  Assessment: Left distal ureter stone- Passed.    Plan: -I took the stone fragment to the [facility] lab for chemical analysis. -Follow up with me in 3 months for metabolic work up for stones. -Hepatitis/transaminitis per primary team. -I gave him a regular diet since he will not need intervention as he passed the stone.   Natalia Leatherwood,  MD [phone]

## 2013-06-12 NOTE — ED Provider Notes (Signed)
Medical screening examination/treatment/procedure(s) were performed by non-physician practitioner and as supervising physician I was immediately available for consultation/collaboration.   Gurbani Figge J Wynton Hufstetler, MD 06/12/13 1448 

## 2013-06-13 NOTE — ED Provider Notes (Signed)
Medical screening examination/treatment/procedure(s) were performed by non-physician practitioner and as supervising physician I was immediately available for consultation/collaboration.   Nelia Shi, MD 06/13/13 [phone]

## 2013-06-17 NOTE — Discharge Summary (Signed)
Etiologies for his acute transaminitis included viral hepatitis, NAFLD, and possibly tylenol ingestion, though he was not taking very high doses.  Other less likely etiologies could be PBC or Gilbert's syndrome. Follow up LFTs were recommended to the patient.

## 2013-06-25 ENCOUNTER — Ambulatory Visit: Payer: PRIVATE HEALTH INSURANCE | Admitting: Internal Medicine

## 2013-08-07 ENCOUNTER — Encounter (HOSPITAL_COMMUNITY): Payer: Self-pay | Admitting: Emergency Medicine

## 2013-08-07 ENCOUNTER — Emergency Department (HOSPITAL_COMMUNITY)
Admission: EM | Admit: 2013-08-07 | Discharge: 2013-08-07 | Disposition: A | Discharge status: Home / Self Care | Payer: PRIVATE HEALTH INSURANCE | Attending: Emergency Medicine | Admitting: Emergency Medicine

## 2013-08-07 DIAGNOSIS — N200 Calculus of kidney: Secondary | ICD-10-CM | POA: Insufficient documentation

## 2013-08-07 DIAGNOSIS — Z8709 Personal history of other diseases of the respiratory system: Secondary | ICD-10-CM | POA: Insufficient documentation

## 2013-08-07 DIAGNOSIS — Z8739 Personal history of other diseases of the musculoskeletal system and connective tissue: Secondary | ICD-10-CM | POA: Insufficient documentation

## 2013-08-07 DIAGNOSIS — Z9089 Acquired absence of other organs: Secondary | ICD-10-CM | POA: Insufficient documentation

## 2013-08-07 DIAGNOSIS — Z8659 Personal history of other mental and behavioral disorders: Secondary | ICD-10-CM | POA: Insufficient documentation

## 2013-08-07 DIAGNOSIS — R61 Generalized hyperhidrosis: Secondary | ICD-10-CM | POA: Insufficient documentation

## 2013-08-07 LAB — URINALYSIS, ROUTINE W REFLEX MICROSCOPIC
Bilirubin Urine: NEGATIVE
Ketones, ur: NEGATIVE mg/dL
Leukocytes, UA: NEGATIVE
Nitrite: NEGATIVE
Protein, ur: NEGATIVE mg/dL
Urobilinogen, UA: 0.2 mg/dL (ref 0.0–1.0)

## 2013-08-07 LAB — POCT I-STAT, CHEM 8
BUN: 13 mg/dL (ref 6–23)
Calcium, Ion: 1.27 mmol/L — ABNORMAL HIGH (ref 1.12–1.23)
Chloride: 99 mEq/L (ref 96–112)
Glucose, Bld: 121 mg/dL — ABNORMAL HIGH (ref 70–99)
HCT: 46 % (ref 39.0–52.0)
Potassium: 4.6 mEq/L (ref 3.5–5.1)

## 2013-08-07 LAB — URINE MICROSCOPIC-ADD ON

## 2013-08-07 MED ORDER — HYDROCODONE-ACETAMINOPHEN 5-325 MG PO TABS: 1.0000 | ORAL_TABLET | Freq: Four times a day (QID) | ORAL | Status: AC | PRN

## 2013-08-07 NOTE — ED Provider Notes (Signed)
CSN: [id number]     Arrival date & time 08/07/13  4098 History   None    Chief Complaint  Patient presents with  . Flank Pain   (Consider location/radiation/quality/duration/timing/severity/associated sxs/prior Treatment) Patient is a 47 y.o. male presenting with flank pain. The history is provided by the patient.  Flank Pain This is a new problem. The current episode started today. The problem occurs constantly. The problem has been resolved. Associated symptoms include abdominal pain, diaphoresis and nausea. Pertinent negatives include no change in bowel habit, chest pain, chills, congestion, fatigue, fever, headaches, joint swelling, myalgias, neck pain, numbness, rash, sore throat, swollen glands, urinary symptoms, vertigo, visual change, vomiting or weakness. Nothing aggravates the symptoms. He has tried nothing for the symptoms. The treatment provided no relief.   The patient states that the pain is identical to the pain he felt while in the ED in September. The pain resolved upon arriving at the ED.   Past Medical History  Diagnosis Date  . Kidney stones   . Ureteral stone 06/08/2013    left distal/notes 06/10/2013  . Chronic bronchitis     "get it often sometimes" (06/10/2013)  . Shortness of breath     "@ various times" (06/10/2013)  . Stomach ulcer     "it's been awhile; absolutely from stress" (06/10/2013)  . Arthritis     "hands" (06/10/2013)  . Anxiety   . Transaminitis     Hattie Perch 06/10/2013   Past Surgical History  Procedure Laterality Date  . Cholecystectomy  ~ 2009  . Tonsillectomy      "as a child" (06/10/2013)   History reviewed. No pertinent family history. History  Substance Use Topics  . Smoking status: Never Smoker   . Smokeless tobacco: Never Used  . Alcohol Use: No    Review of Systems  Constitutional: Positive for diaphoresis. Negative for fever, chills and fatigue.  HENT: Negative for congestion and sore throat.   Cardiovascular: Negative for chest  pain.  Gastrointestinal: Positive for nausea and abdominal pain. Negative for vomiting and change in bowel habit.  Genitourinary: Positive for flank pain.  Musculoskeletal: Negative for joint swelling, myalgias and neck pain.  Skin: Negative for rash.  Neurological: Negative for vertigo, weakness, numbness and headaches.    Allergies  Codeine  Home Medications  No current outpatient prescriptions on file. BP 125/92  Pulse 63  Temp(Src) 97.6 F (36.4 C) (Oral)  Resp 16  SpO2 98% Physical Exam  Constitutional: He is oriented to person, place, and time. He appears well-developed and well-nourished. No distress.  HENT:  Head: Normocephalic and atraumatic.  Mouth/Throat: Oropharynx is clear and moist. No oropharyngeal exudate.  Eyes: EOM are normal.  Cardiovascular: Normal rate, regular rhythm, normal heart sounds and intact distal pulses.  Exam reveals no gallop and no friction rub.   No murmur heard. Pulmonary/Chest: Effort normal and breath sounds normal. No respiratory distress. He has no wheezes. He has no rales. He exhibits no tenderness.  Abdominal: Soft. Bowel sounds are normal. He exhibits no distension and no mass. There is no tenderness. There is no rebound and no guarding.  Musculoskeletal: He exhibits tenderness. He exhibits no edema.  Mild flank tenderness  Neurological: He is alert and oriented to person, place, and time.  Skin: Skin is warm and dry. No rash noted. He is not diaphoretic. No erythema. No pallor.  Psychiatric: He has a normal mood and affect. His behavior is normal.    ED Course  Procedures (including critical  care time) Labs Review Labs Reviewed  URINALYSIS, ROUTINE W REFLEX MICROSCOPIC - Abnormal; Notable for the following:    Color, Urine AMBER (*)    Hgb urine dipstick LARGE (*)    All other components within normal limits  URINE MICROSCOPIC-ADD ON - Abnormal; Notable for the following:    Bacteria, UA FEW (*)    All other components within  normal limits  POCT I-STAT, CHEM 8 - Abnormal; Notable for the following:    Glucose, Bld 121 (*)    Calcium, Ion 1.27 (*)    All other components within normal limits   Imaging Review No results found.  EKG Interpretation   None       MDM   1. Kidney stone on right side     1. Kidney Stone  The patients flank pain is likely due to kidney stone as it is similar to his previous episode and there was a 2 mm stone identifiied by CT in Sept on the right side. UA demonstrated large hemoglobin, which is consistent with kidney stone. Patient was encouraged to established care with PCP. Plan to give patient a short course of pain medicines. He states that he has left over anti nausea medicine from last time. I instructed the patient to return to ED for new or worsening symptoms. I also suggested that the patient use a urine strainer. The patient is in agreement with this plan.      Pleas Koch, MD 08/07/13 [phone]

## 2013-08-07 NOTE — ED Notes (Signed)
Patient with hx of kidney stones  Patient woke up this morning with pain to left side, which he believes may be another kidney stone  Patient states that since he has arrived to ED, that he is no longer in any pain Patient appears anxious, but NAD Family member at bedside and call bell in reach

## 2013-08-07 NOTE — ED Notes (Signed)
Md at bedside

## 2013-08-07 NOTE — ED Provider Notes (Signed)
I saw and evaluated the patient, reviewed the resident's note and I agree with the findings and plan.  Pt has history of renal colic with confirmed ureteral and kidney stones.  Currently is pain free without complaints but had pain similar to prior kidney stones.  Will check ua and creatinine.  Anticipate outpatient treatment.  Celene Kras, MD 08/07/13 [phone]

## 2013-08-07 NOTE — ED Notes (Signed)
Attempted to draw labs, pt refused. Will notify MD.

## 2014-02-14 IMAGING — CT CT ABD-PELV W/ CM
2 of 5 series · 16 of 46 positions shown, 18 images · IV contrast (omnipaque)
Comparison: May 05, 2009

CLINICAL DATA: Abdominal pain

EXAM:
CT ABDOMEN AND PELVIS WITH CONTRAST
TECHNIQUE: Multidetector CT imaging of the abdomen and pelvis was performed
using the standard protocol following bolus administration of
intravenous contrast. Oral contrast was also administered.
CONTRAST:  100mL OMNIPAQUE IOHEXOL 300 MG/ML  SOLN

[Series 2: routine · axial · 0.67mm/px · z∈[-490,-65]mm · 13 of 95 slices shown, 15 images]
[im 5/95  soft-tissue]
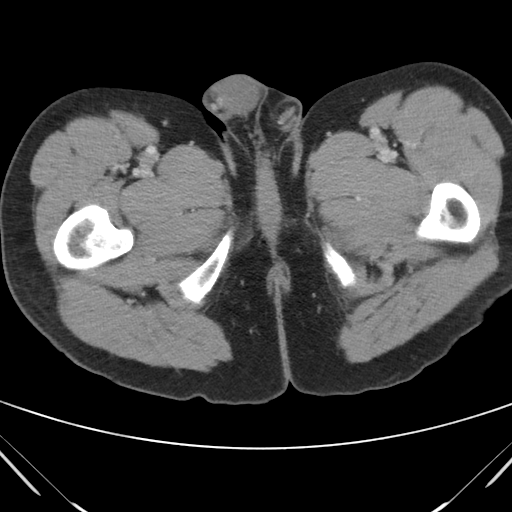
[im 5/95  bone]
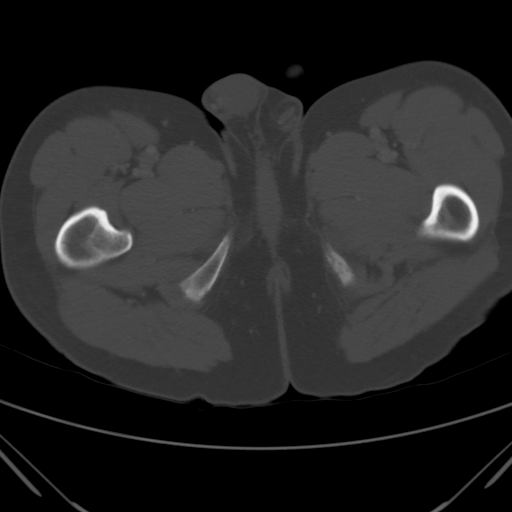
[im 15/95  soft-tissue]
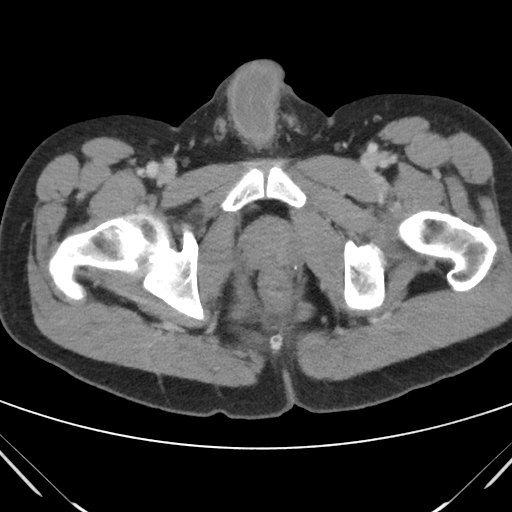
[im 19/95  soft-tissue]
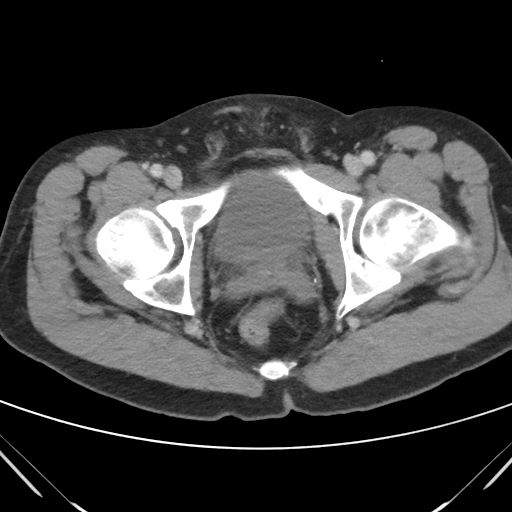
[im 29/95  soft-tissue]
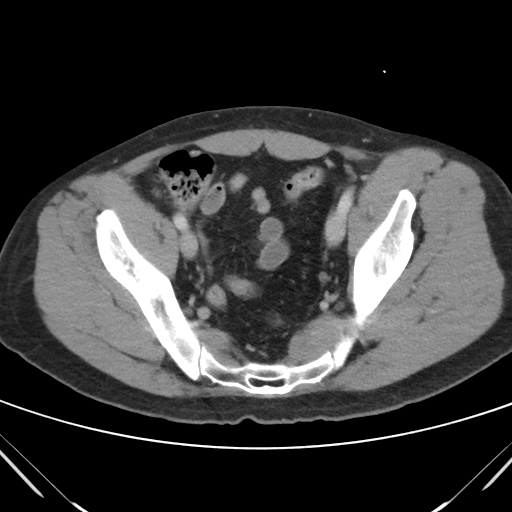
[im 33/95  soft-tissue]
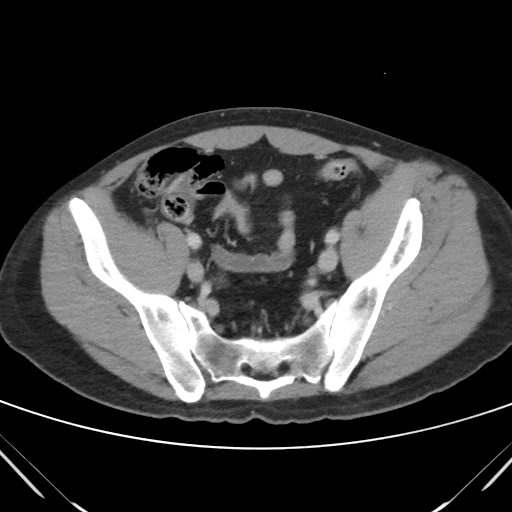
[im 43/95  soft-tissue]
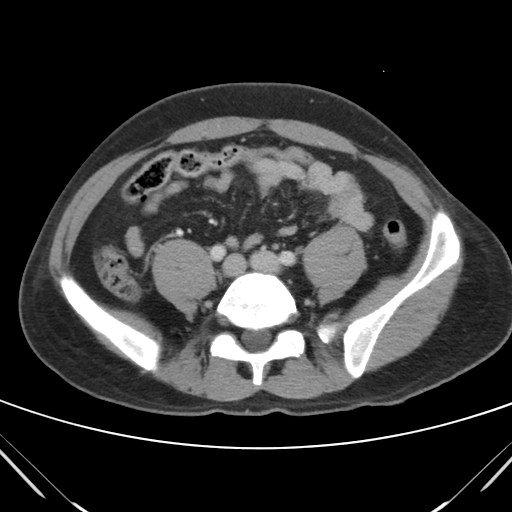
[im 48/95  soft-tissue]
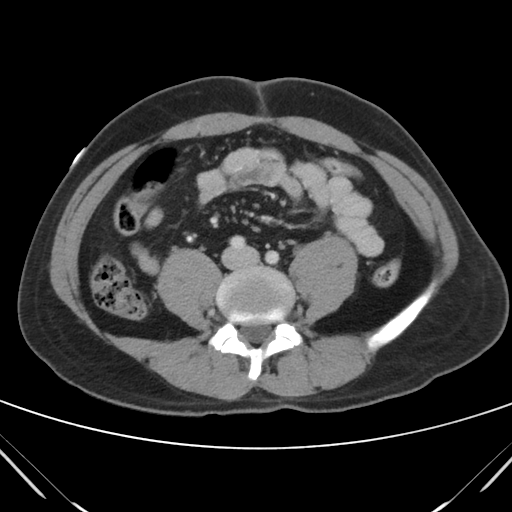
[im 52/95  soft-tissue]
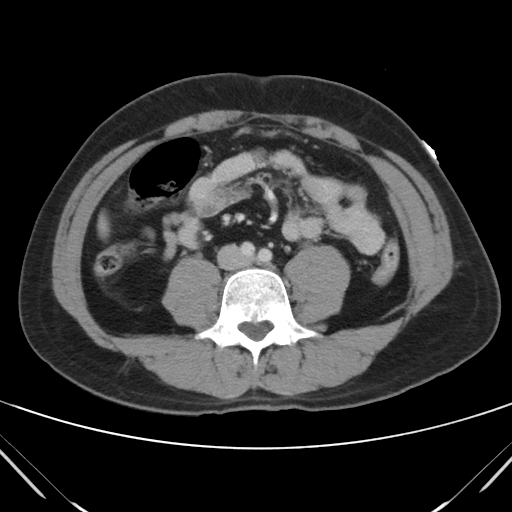
[im 62/95  soft-tissue]
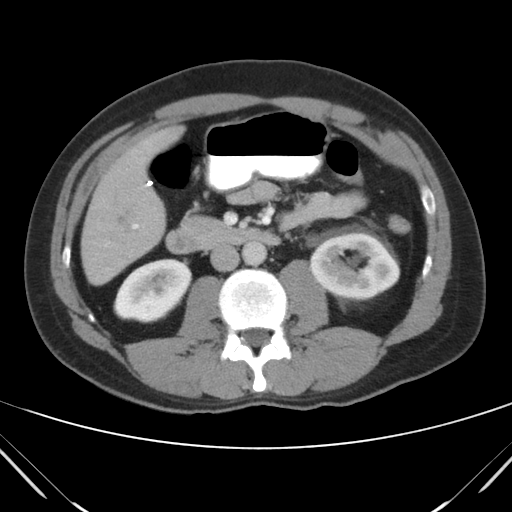
[im 62/95  bone]
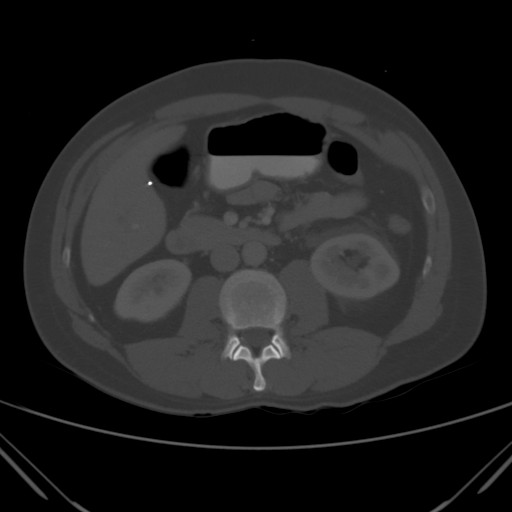
[im 66/95  soft-tissue]
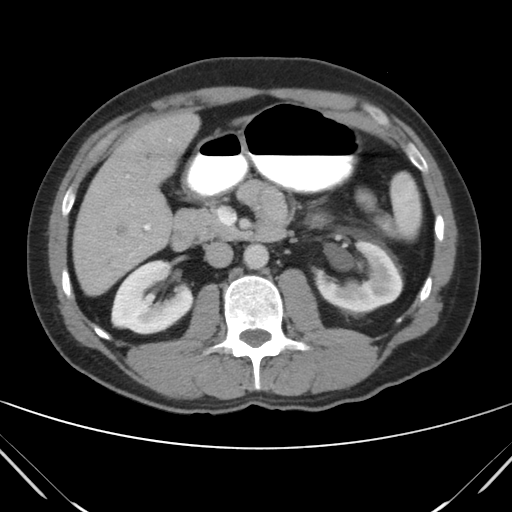
[im 76/95  soft-tissue]
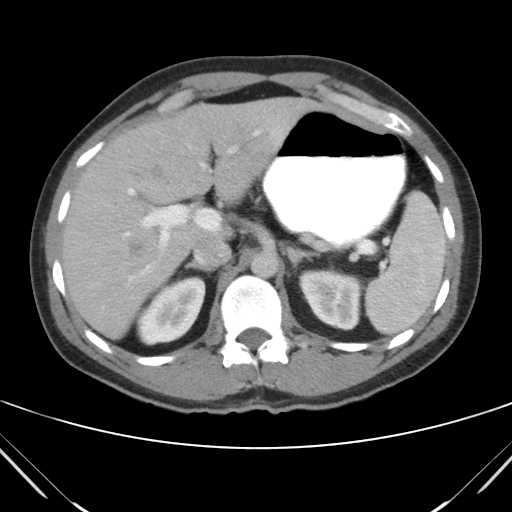
[im 80/95  soft-tissue]
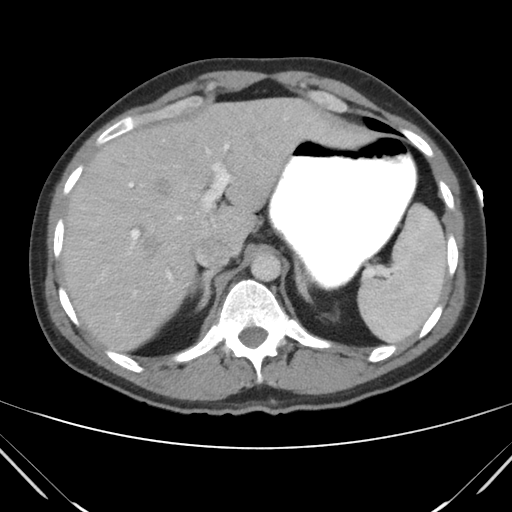
[im 90/95  soft-tissue]
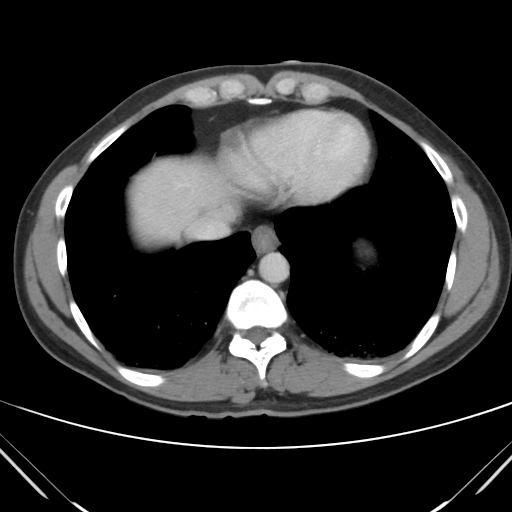

[mpr, coronals, coronal · coronal · 0.92mm/px · 3 of 96 slices shown]
[im 32/96  soft-tissue]
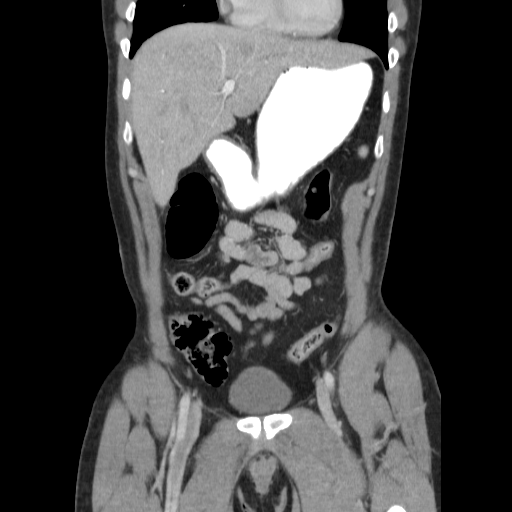
[im 43/96  soft-tissue]
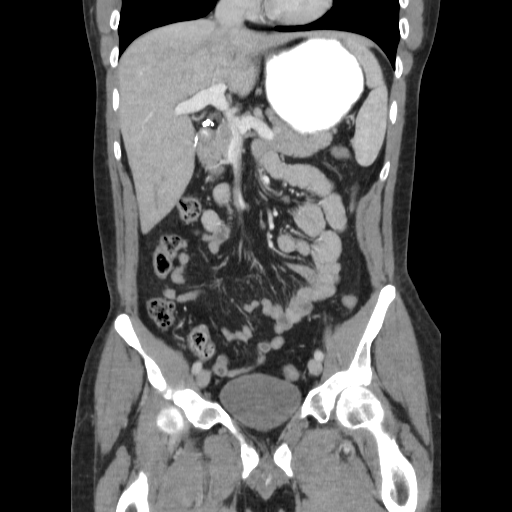
[im 53/96  soft-tissue]
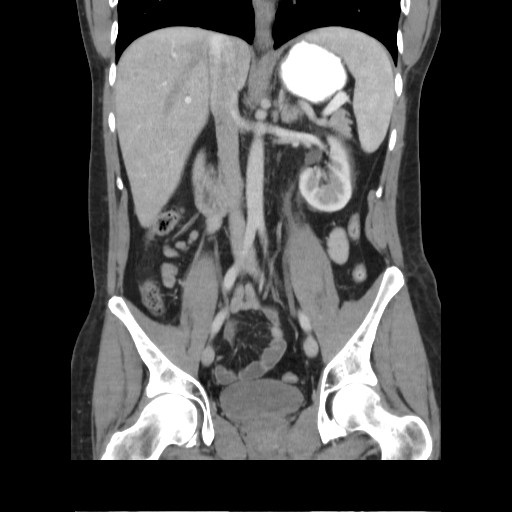

[16 of 46 positions shown; findings below may reference images not displayed]

FINDINGS: There is mild bibasilar lung atelectasis. Lung bases are otherwise
clear. There is a small hiatal hernia.

The liver is enlarged, measuring 20.3 cm in length. There is fatty
infiltration in the liver. No focal liver lesions are identified.
There is no biliary duct dilatation. Gallbladder is absent.

Spleen, pancreas and right adrenal appear normal. There is a 7 x 7
mm mass in the left adrenal gland.

The right kidney contains a 2 mm calculus in the upper to midpole
region, nonobstructing. There is no mass or hydronephrosis on the
right. There is perinephric fluid on the left with moderate
hydronephrosis of the left kidney. There is no intrarenal mass or
calculus. There is a 3 mm calculus at the left ureterovesical
junction causing hydronephrosis on the left. Note that there is some
fluid tracking along the proximal left ureter, probably due to urine
extravasation proximally.

In the pelvis, there is no mass or fluid collection. Appendix
appears normal.

There is no bowel obstruction. No free air or portal venous air.

There is no ascites, adenopathy, or abscess in the abdomen or
pelvis. Aorta is non aneurysmal. There are no blastic or lytic bone
lesions. There is degenerative change at L5-S1.
IMPRESSION: 3 mm calculus at the left ureterovesical junction causing moderate
hydronephrosis on the left. There is left-sided perinephric fluid as
well as urine tracking along the proximal left ureter.

Small nonobstructing calculus in right kidney.

The gallbladder is absent.

The liver is enlarged with fatty infiltration.

Small hiatal hernia.

No bowel obstruction. No abscess.

Subcentimeter left adrenal mass. Particular attention to this area
on subsequent evaluations is advised.

## 2015-05-11 ENCOUNTER — Encounter (HOSPITAL_COMMUNITY): Payer: Self-pay | Admitting: *Deleted

## 2015-05-11 NOTE — ED Notes (Signed)
Pt states that he had blood work drawn and was told to come to the hospital for elevated "kidney labs". Pt denies any other symptoms.

## 2023-01-24 ENCOUNTER — Emergency Department (HOSPITAL_COMMUNITY): Payer: Managed Care, Other (non HMO)

## 2023-01-24 ENCOUNTER — Encounter (HOSPITAL_COMMUNITY): Payer: Self-pay | Admitting: Emergency Medicine

## 2023-01-24 ENCOUNTER — Other Ambulatory Visit: Payer: Self-pay

## 2023-01-24 ENCOUNTER — Emergency Department (HOSPITAL_COMMUNITY)
Admission: EM | Admit: 2023-01-24 | Discharge: 2023-01-24 | Disposition: A | Discharge status: Home / Self Care | Payer: Managed Care, Other (non HMO) | Attending: Emergency Medicine | Admitting: Emergency Medicine

## 2023-01-24 DIAGNOSIS — R109 Unspecified abdominal pain: Secondary | ICD-10-CM | POA: Diagnosis present

## 2023-01-24 DIAGNOSIS — R7309 Other abnormal glucose: Secondary | ICD-10-CM | POA: Insufficient documentation

## 2023-01-24 DIAGNOSIS — N200 Calculus of kidney: Secondary | ICD-10-CM | POA: Insufficient documentation

## 2023-01-24 DIAGNOSIS — R61 Generalized hyperhidrosis: Secondary | ICD-10-CM | POA: Diagnosis not present

## 2023-01-24 LAB — CBC
HCT: 45.2 % (ref 39.0–52.0)
Hemoglobin: 15.6 g/dL (ref 13.0–17.0)
MCH: 29.5 pg (ref 26.0–34.0)
MCHC: 34.5 g/dL (ref 30.0–36.0)
MCV: 85.4 fL (ref 80.0–100.0)
Platelets: 212 10*3/uL (ref 150–400)
RBC: 5.29 MIL/uL (ref 4.22–5.81)
RDW: 12 % (ref 11.5–15.5)
WBC: 7.5 10*3/uL (ref 4.0–10.5)
nRBC: 0 % (ref 0.0–0.2)

## 2023-01-24 LAB — COMPREHENSIVE METABOLIC PANEL
ALT: 25 U/L (ref 0–44)
AST: 22 U/L (ref 15–41)
Albumin: 4.3 g/dL (ref 3.5–5.0)
Alkaline Phosphatase: 82 U/L (ref 38–126)
Anion gap: 13 (ref 5–15)
BUN: 14 mg/dL (ref 6–20)
CO2: 22 mmol/L (ref 22–32)
Calcium: 8.9 mg/dL (ref 8.9–10.3)
Chloride: 100 mmol/L (ref 98–111)
Creatinine, Ser: 0.92 mg/dL (ref 0.61–1.24)
GFR, Estimated: >60 mL/min (ref >60)
Glucose, Bld: 104 mg/dL — ABNORMAL HIGH (ref 70–99)
Potassium: 3.8 mmol/L (ref 3.5–5.1)
Sodium: 135 mmol/L (ref 135–145)
Total Bilirubin: 1.1 mg/dL (ref 0.3–1.2)
Total Protein: 7 g/dL (ref 6.5–8.1)

## 2023-01-24 LAB — TROPONIN I (HIGH SENSITIVITY)
Troponin I (High Sensitivity): 3 ng/L (ref <18)
Troponin I (High Sensitivity): 2 ng/L (ref <18)

## 2023-01-24 MED ORDER — LORAZEPAM 1 MG PO TABS
0.5000 mg | ORAL_TABLET | Freq: Once | ORAL | Status: AC
  Administered 2023-01-24: 0.5 mg via ORAL
  Filled 2023-01-24: qty 1

## 2023-01-24 NOTE — ED Provider Triage Note (Signed)
Emergency Medicine Provider Triage Evaluation Note  [name] , a 57 y.o. male  was evaluated in triage.  Pt complains of sudden sharp left-sided flank pain that started all of a sudden at work this morning around 7:45 AM.  Patient reports that the pain lasted around 10 minutes.  Patient reports that he has been under a lot of stress, working more than normal, he reports that his blood pressure was elevated today, blood pressure 161/112 on arrival, reports that normally has not had to take any blood pressure medication.  Patient does have a previous history of kidney stones, reports that this feels dissimilar from previous kidney stones.  He denies any dysuria, hematuria.  When asked how he is feeling overall patient reports that he "feels his blood pressure", endorses some redness of the ears, general fatigue, some vague tingling of nose, ears, feet bilaterally.  Review of Systems  Positive: Flank pain Negative: Chest pain, shob  Physical Exam  BP (!) 161/112   Pulse 83   Temp 98.9 F (37.2 C) (Oral)   Resp 18   SpO2 100%  Gen:   Awake, no distress   Resp:  Normal effort  MSK:   Moves extremities without difficulty  Other:    Medical Decision Making  Medically screening exam initiated at 3:04 PM.  Appropriate orders placed.  Frederich Chick was informed that the remainder of the evaluation will be completed by another provider, this initial triage assessment does not replace that evaluation, and the importance of remaining in the ED until their evaluation is complete.  Workup initiated in triage    Olene Floss, [place] 01/24/23 1506

## 2023-01-24 NOTE — ED Provider Notes (Signed)
[place] EMERGENCY DEPARTMENT AT [facility] Provider Note   CSN: [id number] Arrival date & time: 01/24/23  1431     History  Chief Complaint  Patient presents with   Flank Pain    [name] is a 57 y.o. male with medical history of kidney stone, anxiety, transaminitis.  Patient presents to ED for evaluation left-sided flank pain.  Patient reports this morning around 7:48 AM he developed sudden onset left-sided flank pain.  Patient reports that he became diaphoretic, pale when this occurred.  Patient states that the flank pain lasted for about 10 minutes and then resolved.  Patient states that his coworkers noticed that he was looking pale and advised him to lay flat on the floor.  Patient states he has had no return of this pain since this morning.  The patient denies any nausea, vomiting, fevers, diarrhea, abdominal pain, dysuria, urinary symptoms.  Denies chest pain or shortness of breath.  Of note, the patient reports that he has been under tremendous amount of stress recently secondary to his personal life.  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  He is unsure if stress could be contributing to his symptoms today.   Flank Pain Pertinent negatives include no chest pain and no shortness of breath.       Home Medications Prior to Admission medications   Medication Sig Start Date End Date Taking? Authorizing Provider  HYDROcodone-acetaminophen (NORCO/VICODIN) 5-325 MG per tablet Take 1 tablet by mouth every 6 (six) hours as needed for moderate pain. 08/07/13   Bobbye Charleston, MD      Allergies    Codeine    Review of Systems   Review of Systems  Constitutional:  Positive for diaphoresis.  Respiratory:  Negative for shortness of breath.   Cardiovascular:  Negative for chest pain.  Gastrointestinal:  Negative for nausea and vomiting.   Genitourinary:  Positive for flank pain. Negative for dysuria.  All other systems reviewed and are negative.   Physical Exam Updated Vital Signs BP (!) 141/96 (BP Location: Right Arm)   Pulse 72   Temp 98.2 F (36.8 C) (Oral)   Resp 19   SpO2 97%  Physical Exam Vitals and nursing note reviewed.  Constitutional:      General: He is not in acute distress.    Appearance: He is well-developed.  HENT:     Head: Normocephalic and atraumatic.  Eyes:     Conjunctiva/sclera: Conjunctivae normal.  Cardiovascular:     Rate and Rhythm: Normal rate and regular rhythm.     Heart sounds: No murmur heard. Pulmonary:     Effort: Pulmonary effort is normal. No respiratory distress.     Breath sounds: Normal breath sounds.  Abdominal:     Palpations: Abdomen is soft.     Tenderness: There is no abdominal tenderness. There is no right CVA tenderness or left CVA tenderness.  Musculoskeletal:        General: No swelling.     Cervical back: Neck supple.  Skin:    General: Skin is warm and dry.     Capillary Refill: Capillary refill takes less than 2 seconds.  Neurological:     Mental Status: He is alert and oriented to person, place, and time.  Psychiatric:        Mood and Affect: Mood normal.  ED Results / Procedures / Treatments   Labs (all labs ordered are listed, but only abnormal results are displayed) Labs Reviewed  COMPREHENSIVE METABOLIC PANEL - Abnormal; Notable for the following components:      Result Value   Glucose, Bld 104 (*)    All other components within normal limits  CBC  TROPONIN I (HIGH SENSITIVITY)  TROPONIN I (HIGH SENSITIVITY)    EKG None  Radiology DG Chest 2 View  Result Date: 01/24/2023 CLINICAL DATA:  Chest pain EXAM: CHEST - 2 VIEW COMPARISON:  X-ray 06/08/2013 FINDINGS: No consolidation, pneumothorax or effusion. No edema. Normal cardiopericardial silhouette. Surgical clips in the right upper quadrant. IMPRESSION: No acute cardiopulmonary  disease. Electronically Signed   By: Karen Kays M.D.   On: 01/24/2023 17:08   CT Renal Stone Study  Result Date: 01/24/2023 CLINICAL DATA:  Left flank pain EXAM: CT ABDOMEN AND PELVIS WITHOUT CONTRAST TECHNIQUE: Multidetector CT imaging of the abdomen and pelvis was performed following the standard protocol without IV contrast. RADIATION DOSE REDUCTION: This exam was performed according to the departmental dose-optimization program which includes automated exposure control, adjustment of the mA and/or kV according to patient size and/or use of iterative reconstruction technique. COMPARISON:  CT AP 06/08/13 FINDINGS: Lower chest: No acute abnormality. Lack of IV contrast limits the ability to assess the abdominal and pelvic solid organs. Hepatobiliary: Liver has a normal contour. No perihepatic fluid. No focal liver lesions.Gallbladder is surgically absent. Pancreas: No peripancreatic fat stranding to suggest pancreatitis. Spleen: Normal in size without focal abnormality. Adrenals/Urinary Tract: Bilateral glands are normal in appearance. No evidence of hydronephrosis. There is a 2 mm stone lower pole of the left kidney. There are pelvic phleboliths. No evidence of bladder stone. The urinary bladder is fluid-filled without focal abnormalities. Stomach/Bowel: No evidence of bowel obstruction. There is mild diverticulosis without evidence of diverticulitis. The appendix is normal in appearance. Vascular/Lymphatic: No significant vascular findings are present. No enlarged abdominal or pelvic lymph nodes. Reproductive: Prostate is unremarkable. Other: No abdominal wall hernia or abnormality. No abdominopelvic ascites. Musculoskeletal: No acute or significant osseous findings. IMPRESSION: 2 mm non-obstructing renal stone lower pole of the left kidney. Electronically Signed   By: Lorenza Cambridge M.D.   On: 01/24/2023 16:25    Procedures Procedures   Medications Ordered in ED Medications  LORazepam (ATIVAN) tablet  0.5 mg (0.5 mg Oral Given 01/24/23 1543)    ED Course/ Medical Decision Making/ A&P  Medical Decision Making Risk Prescription drug management.   57 year old male presents to the ED for evaluation.  Please see HPI for further details.  On examination the patient is afebrile and nontachycardic.  He has no CVA tenderness bilaterally, his abdomen is soft and compressible.  He is not hypoxic, his lung sounds are clear bilaterally.  He is nontoxic in appearance.  He is alert and oriented.  Workup initiated in triage include CBC, CMP, troponin, chest x-ray, EKG and CT renal stone study.  CBC without leukocytosis or anemia. CMP with glucose 104, no electrolyte derangement, no elevated creatinine, no LFT elevation. Patient troponin 3, 2 with delta. EKG nonischemic Chest x-ray unremarkable CT renal stone study shows 2 mm nonobstructing stone in left lower pole of kidney.  Patient pain this morning could be result of this kidney stone moving.  There is no evidence of hydronephrosis, he does not have an elevated creatinine, he is not in any pain currently.  Patient under tremendous mental stress, given 0.5 mg of Ativan.  On reassessment, patient reports that he feels better. He has no flank pain.  The patient will be discharged home at this time.  He will be advised to follow-up with his PCP.  He was encouraged to remain hydrated and push fluids.  He was given return precautions and he voiced understanding.  He had all of his questions answered his satisfaction.  He is stable to discharge at this time.   Final Clinical Impression(s) / ED Diagnoses Final diagnoses:  Left flank pain    Rx / DC Orders ED Discharge Orders     None         Clent Ridges 01/24/23 1754    Linwood Dibbles, MD 01/25/23 1616

## 2023-01-24 NOTE — ED Triage Notes (Signed)
Pt reports left sided flank pain that started all of a sudden while at work today. PT stated the pain lasted around 10 minutes.

## 2023-01-24 NOTE — Discharge Instructions (Signed)
Please return to the ED with any new or worsening signs or symptoms Please follow-up with your PCP for reevaluation.  Please continue remaining hydrated and pushing fluids. For your sleep, please purchase melatonin, 10 mg.  You may also take Benadryl 25 mg to assist in getting to bed. When you are settled in [place], please establish care with a PCP. Please read the attached guide concerning flank pain
